# Patient Record
Sex: Female | Born: 1935
Health system: Southern US, Community
[De-identification: ages and names within clinical notes are randomized; demographics above are authoritative.]

## PROBLEM LIST (undated history)

## (undated) DIAGNOSIS — F329 Major depressive disorder, single episode, unspecified: Secondary | ICD-10-CM

## (undated) DIAGNOSIS — E119 Type 2 diabetes mellitus without complications: Secondary | ICD-10-CM

## (undated) DIAGNOSIS — F419 Anxiety disorder, unspecified: Secondary | ICD-10-CM

## (undated) DIAGNOSIS — I10 Essential (primary) hypertension: Secondary | ICD-10-CM

## (undated) DIAGNOSIS — M1990 Unspecified osteoarthritis, unspecified site: Secondary | ICD-10-CM

## (undated) DIAGNOSIS — N189 Chronic kidney disease, unspecified: Secondary | ICD-10-CM

## (undated) DIAGNOSIS — F32A Depression, unspecified: Secondary | ICD-10-CM

## (undated) DIAGNOSIS — C801 Malignant (primary) neoplasm, unspecified: Secondary | ICD-10-CM

## (undated) DIAGNOSIS — K219 Gastro-esophageal reflux disease without esophagitis: Secondary | ICD-10-CM

## (undated) DIAGNOSIS — IMO0002 Reserved for concepts with insufficient information to code with codable children: Secondary | ICD-10-CM

## (undated) DIAGNOSIS — E785 Hyperlipidemia, unspecified: Secondary | ICD-10-CM

## (undated) HISTORY — DX: Chronic kidney disease, unspecified: N18.9

## (undated) HISTORY — DX: Type 2 diabetes mellitus without complications: E11.9

## (undated) HISTORY — DX: Gastro-esophageal reflux disease without esophagitis: K21.9

## (undated) HISTORY — PX: MOHS SURGERY: SHX181

## (undated) HISTORY — DX: Depression, unspecified: F32.A

## (undated) HISTORY — PX: CHOLECYSTECTOMY: SHX55

## (undated) HISTORY — DX: Hyperlipidemia, unspecified: E78.5

## (undated) HISTORY — DX: Unspecified osteoarthritis, unspecified site: M19.90

## (undated) HISTORY — DX: Reserved for concepts with insufficient information to code with codable children: IMO0002

## (undated) HISTORY — DX: Major depressive disorder, single episode, unspecified: F32.9

## (undated) HISTORY — DX: Essential (primary) hypertension: I10

## (undated) HISTORY — PX: REPLACEMENT TOTAL KNEE: SUR1224

## (undated) HISTORY — DX: Anxiety disorder, unspecified: F41.9

## (undated) HISTORY — DX: Malignant (primary) neoplasm, unspecified: C80.1

---

## 2010-05-23 ENCOUNTER — Emergency Department (HOSPITAL_COMMUNITY)
Admission: EM | Admit: 2010-05-23 | Discharge: 2010-05-23 | Disposition: A | Discharge status: Home / Self Care | Payer: No Typology Code available for payment source | Attending: Emergency Medicine | Admitting: Emergency Medicine

## 2010-05-23 ENCOUNTER — Emergency Department (HOSPITAL_COMMUNITY): Payer: No Typology Code available for payment source

## 2010-05-23 DIAGNOSIS — R609 Edema, unspecified: Secondary | ICD-10-CM | POA: Insufficient documentation

## 2010-05-23 DIAGNOSIS — X58XXXA Exposure to other specified factors, initial encounter: Secondary | ICD-10-CM | POA: Insufficient documentation

## 2010-05-23 DIAGNOSIS — M109 Gout, unspecified: Secondary | ICD-10-CM | POA: Insufficient documentation

## 2010-05-23 DIAGNOSIS — F29 Unspecified psychosis not due to a substance or known physiological condition: Secondary | ICD-10-CM | POA: Insufficient documentation

## 2010-05-23 DIAGNOSIS — M7989 Other specified soft tissue disorders: Secondary | ICD-10-CM | POA: Insufficient documentation

## 2010-05-23 DIAGNOSIS — M129 Arthropathy, unspecified: Secondary | ICD-10-CM | POA: Insufficient documentation

## 2010-05-23 DIAGNOSIS — Y929 Unspecified place or not applicable: Secondary | ICD-10-CM | POA: Insufficient documentation

## 2010-05-23 DIAGNOSIS — M25579 Pain in unspecified ankle and joints of unspecified foot: Secondary | ICD-10-CM | POA: Insufficient documentation

## 2010-05-23 DIAGNOSIS — E119 Type 2 diabetes mellitus without complications: Secondary | ICD-10-CM | POA: Insufficient documentation

## 2010-05-23 DIAGNOSIS — L02619 Cutaneous abscess of unspecified foot: Secondary | ICD-10-CM | POA: Insufficient documentation

## 2010-05-23 DIAGNOSIS — IMO0002 Reserved for concepts with insufficient information to code with codable children: Secondary | ICD-10-CM | POA: Insufficient documentation

## 2010-05-23 LAB — POCT I-STAT, CHEM 8
BUN: 16 mg/dL (ref 6–23)
Calcium, Ion: 1.11 mmol/L — ABNORMAL LOW (ref 1.12–1.32)
Creatinine, Ser: 1.4 mg/dL — ABNORMAL HIGH (ref 0.4–1.2)
Glucose, Bld: 125 mg/dL — ABNORMAL HIGH (ref 70–99)
Hemoglobin: 11.2 g/dL — ABNORMAL LOW (ref 12.0–15.0)
TCO2: 33 mmol/L (ref 0–100)

## 2010-05-23 LAB — DIFFERENTIAL
Basophils Relative: 0 % (ref 0–1)
Eosinophils Absolute: 0 10*3/uL (ref 0.0–0.7)
Eosinophils Relative: 0 % (ref 0–5)
Monocytes Absolute: 1.3 10*3/uL — ABNORMAL HIGH (ref 0.1–1.0)
Monocytes Relative: 13 % — ABNORMAL HIGH (ref 3–12)

## 2010-05-23 LAB — CBC
Hemoglobin: 10.9 g/dL — ABNORMAL LOW (ref 12.0–15.0)
MCH: 29.4 pg (ref 26.0–34.0)
MCHC: 33.2 g/dL (ref 30.0–36.0)
Platelets: 206 10*3/uL (ref 150–400)
RDW: 13.6 % (ref 11.5–15.5)

## 2012-09-25 IMAGING — CR DG CHEST 2V
1 series · 1 of 1 positions shown · non-contrast
Comparison: None.

CLINICAL DATA: Syncope.  Bilateral foot swelling and pain.

CHEST - 2 VIEW

[w chest lat *]
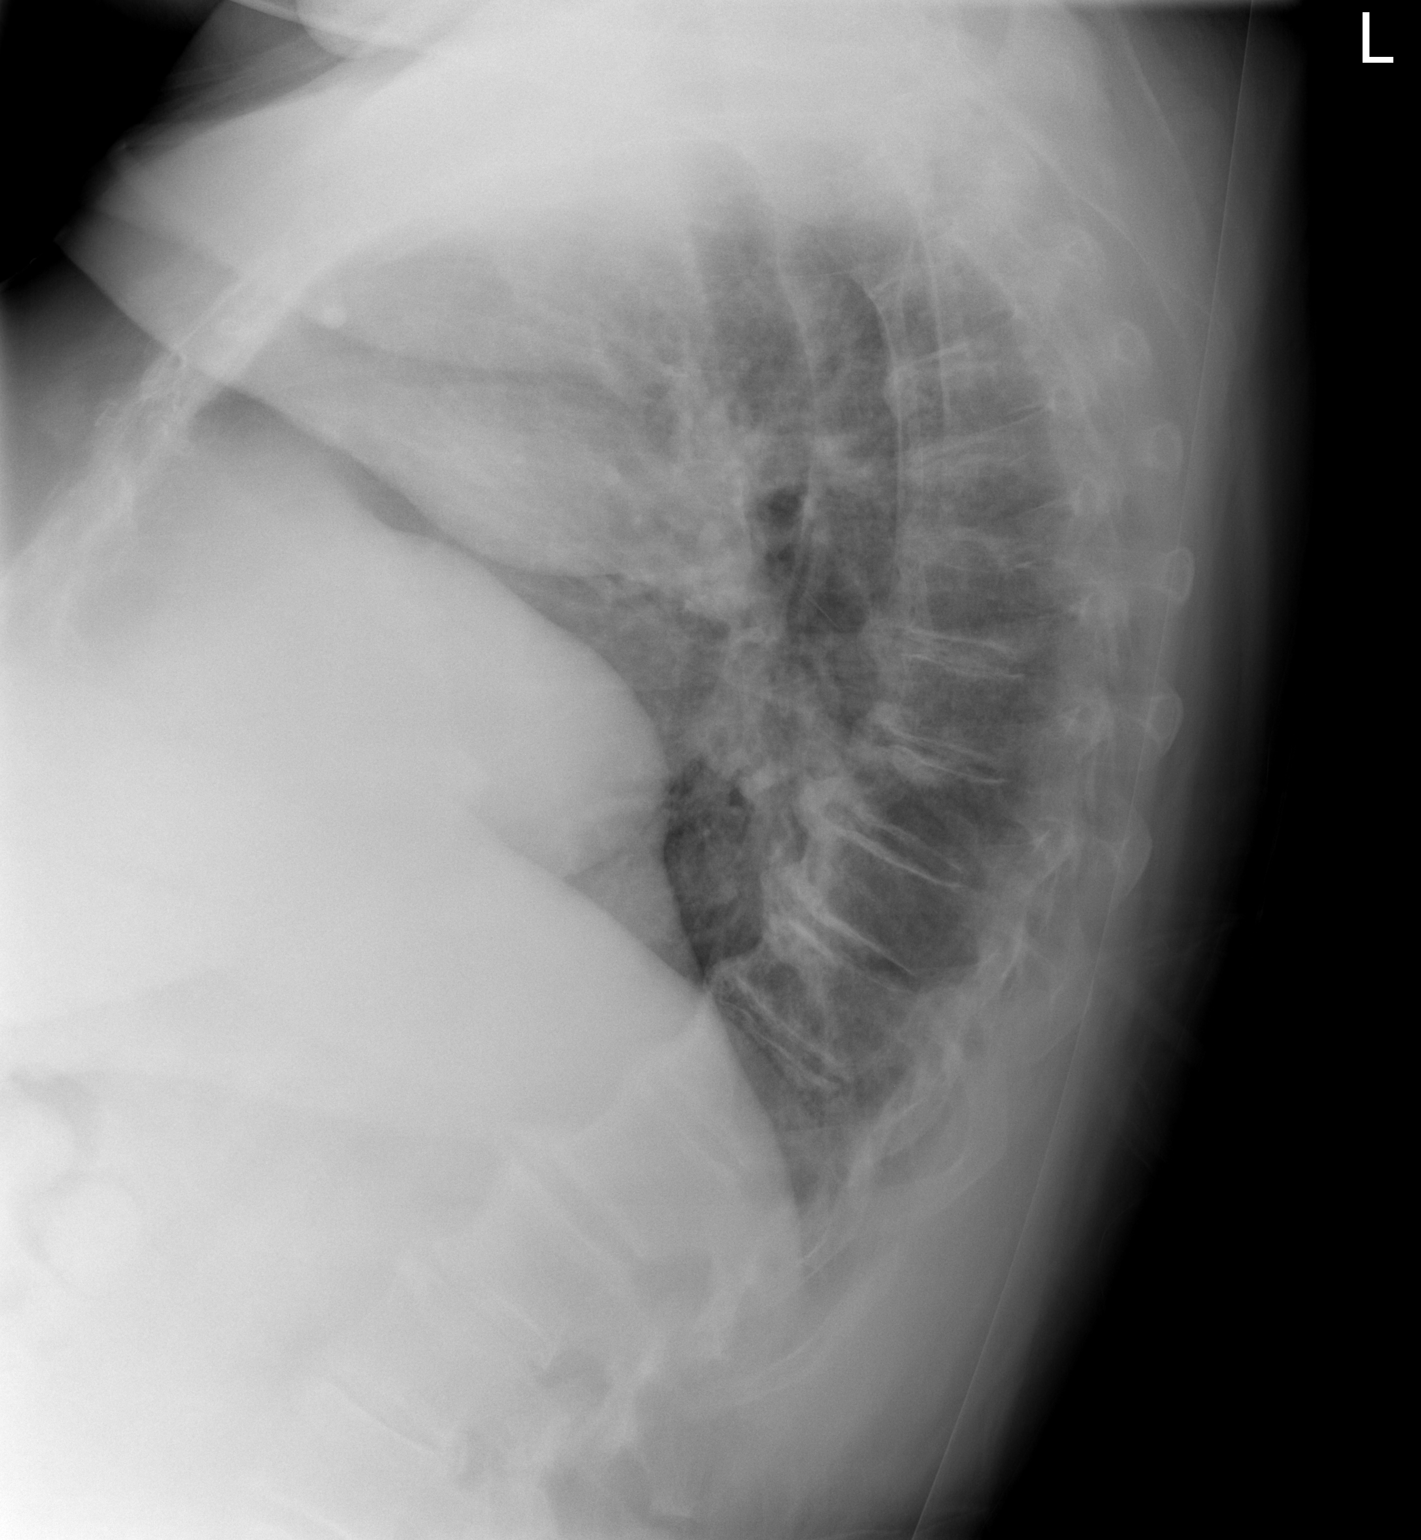

[1 of 1 positions shown; findings below may reference images not displayed]

FINDINGS: Shallow inspiration.  Increased heart size with normal
pulmonary vascularity.  No focal airspace disease or consolidation.
No pulmonary edema.  No blunting of costophrenic angles.
Degenerative changes in the spine.
IMPRESSION: Cardiac enlargement.  No evidence of active pulmonary disease.

## 2013-05-03 ENCOUNTER — Ambulatory Visit (INDEPENDENT_AMBULATORY_CARE_PROVIDER_SITE_OTHER): Payer: Medicare HMO

## 2013-05-03 ENCOUNTER — Ambulatory Visit (INDEPENDENT_AMBULATORY_CARE_PROVIDER_SITE_OTHER): Payer: Medicare HMO | Admitting: Podiatrist

## 2013-05-03 ENCOUNTER — Encounter: Payer: Self-pay | Admitting: Podiatrist

## 2013-05-03 VITALS — BP 143/70 | HR 61 | Resp 18

## 2013-05-03 DIAGNOSIS — M109 Gout, unspecified: Secondary | ICD-10-CM

## 2013-05-03 DIAGNOSIS — R52 Pain, unspecified: Secondary | ICD-10-CM

## 2013-05-03 NOTE — Progress Notes (Signed)
   Subjective:    Patient ID: Carla Robinson, female    DOB: Mar 31, 1934, 79 y.o.   MRN: [id number]  HPI patient presents today complaining of left foot pain. She states "my left foot has been going on for about since Friday and I took allopurinol and cochicine and it still didn't help and Dr. Truman Hayward put me on them and took me off of the fluid pill and now I am swelling in my legs and feet and he took me off of my diabetic medicine due I was in coma and my sugar was low" she states that after taking the colcrys her foot started to feel better. She does still have pain on the lateral forefoot. She also states that she has severe back pain and she is going to see Dr. Truman Hayward on Saturday about this.  Review of Systems  Endocrine: Positive for heat intolerance.  Musculoskeletal: Positive for back pain.  Skin:       Both legs   Neurological: Positive for numbness.  Hematological: Bruises/bleeds easily.  All other systems reviewed and are negative.      Objective:   Physical Exam Neurovascular status is intact with palpable pedal pulses at 2/4 DP and PT bilateral and capillary refill time within normal limits. Neurological sensation is also intact bilaterally both epicriticaly and protectively. Musculoskeletal examination reveals acceptable muscle, strength, tone and stability. Musculature intact with dorsiflexion, plantarflexion, inversion, eversion.  Mild swelling at the left foot compared to the right on the dorsal lateral aspect of the foot. No pain with dorsiflexion and plantarflexion of the first metatarsophalangeal joint left noted. Pinpoint discomfort dorsal lateral aspect with calor and redness present. No sign of infection present no open wounds or lesions of the foot noted.  X-rays are negative for fracture. No obvious gouty changes are noted.     Assessment & Plan:  Acute gout attack left  Plan: Injected Kenalog and Marcaine mixture into the left foot at the area of maximal tenderness under  sterile technique. The patient tolerated this well. I discussed having lab performed to analyze her uric acid levels and to see if the allopurinol is working for her. She states that she is going to see Dr. Truman Hayward on Saturday and thus I recommended her having the labs done at that time. She is to continue using the colcrys as needed. If the injection is not beneficial she will call and let me know.

## 2013-05-03 NOTE — Patient Instructions (Signed)

## 2014-01-21 DIAGNOSIS — R945 Abnormal results of liver function studies: Secondary | ICD-10-CM | POA: Diagnosis not present

## 2014-01-21 DIAGNOSIS — F419 Anxiety disorder, unspecified: Secondary | ICD-10-CM | POA: Diagnosis not present

## 2014-01-21 DIAGNOSIS — R600 Localized edema: Secondary | ICD-10-CM | POA: Diagnosis not present

## 2014-01-21 DIAGNOSIS — M810 Age-related osteoporosis without current pathological fracture: Secondary | ICD-10-CM | POA: Diagnosis not present

## 2014-01-21 DIAGNOSIS — E119 Type 2 diabetes mellitus without complications: Secondary | ICD-10-CM | POA: Diagnosis not present

## 2014-01-21 DIAGNOSIS — N189 Chronic kidney disease, unspecified: Secondary | ICD-10-CM | POA: Diagnosis not present

## 2014-01-21 DIAGNOSIS — E1140 Type 2 diabetes mellitus with diabetic neuropathy, unspecified: Secondary | ICD-10-CM | POA: Diagnosis not present

## 2014-01-21 DIAGNOSIS — F329 Major depressive disorder, single episode, unspecified: Secondary | ICD-10-CM | POA: Diagnosis not present

## 2014-02-18 DIAGNOSIS — E1140 Type 2 diabetes mellitus with diabetic neuropathy, unspecified: Secondary | ICD-10-CM | POA: Diagnosis not present

## 2014-02-18 DIAGNOSIS — M109 Gout, unspecified: Secondary | ICD-10-CM | POA: Diagnosis not present

## 2014-02-18 DIAGNOSIS — Z1389 Encounter for screening for other disorder: Secondary | ICD-10-CM | POA: Diagnosis not present

## 2014-02-18 DIAGNOSIS — F419 Anxiety disorder, unspecified: Secondary | ICD-10-CM | POA: Diagnosis not present

## 2014-02-18 DIAGNOSIS — E119 Type 2 diabetes mellitus without complications: Secondary | ICD-10-CM | POA: Diagnosis not present

## 2014-02-18 DIAGNOSIS — M810 Age-related osteoporosis without current pathological fracture: Secondary | ICD-10-CM | POA: Diagnosis not present

## 2014-02-18 DIAGNOSIS — Z9181 History of falling: Secondary | ICD-10-CM | POA: Diagnosis not present

## 2014-02-18 DIAGNOSIS — E780 Pure hypercholesterolemia: Secondary | ICD-10-CM | POA: Diagnosis not present

## 2014-03-18 DIAGNOSIS — R600 Localized edema: Secondary | ICD-10-CM | POA: Diagnosis not present

## 2014-03-18 DIAGNOSIS — R945 Abnormal results of liver function studies: Secondary | ICD-10-CM | POA: Diagnosis not present

## 2014-03-18 DIAGNOSIS — F419 Anxiety disorder, unspecified: Secondary | ICD-10-CM | POA: Diagnosis not present

## 2014-03-18 DIAGNOSIS — I10 Essential (primary) hypertension: Secondary | ICD-10-CM | POA: Diagnosis not present

## 2014-03-18 DIAGNOSIS — F329 Major depressive disorder, single episode, unspecified: Secondary | ICD-10-CM | POA: Diagnosis not present

## 2014-03-18 DIAGNOSIS — E119 Type 2 diabetes mellitus without complications: Secondary | ICD-10-CM | POA: Diagnosis not present

## 2014-03-18 DIAGNOSIS — M810 Age-related osteoporosis without current pathological fracture: Secondary | ICD-10-CM | POA: Diagnosis not present

## 2014-03-18 DIAGNOSIS — E1140 Type 2 diabetes mellitus with diabetic neuropathy, unspecified: Secondary | ICD-10-CM | POA: Diagnosis not present

## 2014-03-18 DIAGNOSIS — E780 Pure hypercholesterolemia: Secondary | ICD-10-CM | POA: Diagnosis not present

## 2014-04-01 DIAGNOSIS — E119 Type 2 diabetes mellitus without complications: Secondary | ICD-10-CM | POA: Diagnosis not present

## 2014-04-01 DIAGNOSIS — E780 Pure hypercholesterolemia: Secondary | ICD-10-CM | POA: Diagnosis not present

## 2014-04-01 DIAGNOSIS — R600 Localized edema: Secondary | ICD-10-CM | POA: Diagnosis not present

## 2014-04-01 DIAGNOSIS — E1140 Type 2 diabetes mellitus with diabetic neuropathy, unspecified: Secondary | ICD-10-CM | POA: Diagnosis not present

## 2014-04-01 DIAGNOSIS — F419 Anxiety disorder, unspecified: Secondary | ICD-10-CM | POA: Diagnosis not present

## 2014-04-01 DIAGNOSIS — M810 Age-related osteoporosis without current pathological fracture: Secondary | ICD-10-CM | POA: Diagnosis not present

## 2014-04-01 DIAGNOSIS — F329 Major depressive disorder, single episode, unspecified: Secondary | ICD-10-CM | POA: Diagnosis not present

## 2014-04-01 DIAGNOSIS — R945 Abnormal results of liver function studies: Secondary | ICD-10-CM | POA: Diagnosis not present

## 2014-04-05 DIAGNOSIS — Z13820 Encounter for screening for osteoporosis: Secondary | ICD-10-CM | POA: Diagnosis not present

## 2014-04-05 DIAGNOSIS — M810 Age-related osteoporosis without current pathological fracture: Secondary | ICD-10-CM | POA: Diagnosis not present

## 2014-04-15 DIAGNOSIS — F329 Major depressive disorder, single episode, unspecified: Secondary | ICD-10-CM | POA: Diagnosis not present

## 2014-04-15 DIAGNOSIS — R945 Abnormal results of liver function studies: Secondary | ICD-10-CM | POA: Diagnosis not present

## 2014-04-15 DIAGNOSIS — E119 Type 2 diabetes mellitus without complications: Secondary | ICD-10-CM | POA: Diagnosis not present

## 2014-04-15 DIAGNOSIS — M180 Bilateral primary osteoarthritis of first carpometacarpal joints: Secondary | ICD-10-CM | POA: Diagnosis not present

## 2014-04-15 DIAGNOSIS — E780 Pure hypercholesterolemia: Secondary | ICD-10-CM | POA: Diagnosis not present

## 2014-04-15 DIAGNOSIS — I10 Essential (primary) hypertension: Secondary | ICD-10-CM | POA: Diagnosis not present

## 2014-04-15 DIAGNOSIS — F419 Anxiety disorder, unspecified: Secondary | ICD-10-CM | POA: Diagnosis not present

## 2014-04-15 DIAGNOSIS — R600 Localized edema: Secondary | ICD-10-CM | POA: Diagnosis not present

## 2014-04-15 DIAGNOSIS — E1140 Type 2 diabetes mellitus with diabetic neuropathy, unspecified: Secondary | ICD-10-CM | POA: Diagnosis not present

## 2014-04-29 DIAGNOSIS — F329 Major depressive disorder, single episode, unspecified: Secondary | ICD-10-CM | POA: Diagnosis not present

## 2014-04-29 DIAGNOSIS — K219 Gastro-esophageal reflux disease without esophagitis: Secondary | ICD-10-CM | POA: Diagnosis not present

## 2014-04-29 DIAGNOSIS — R945 Abnormal results of liver function studies: Secondary | ICD-10-CM | POA: Diagnosis not present

## 2014-04-29 DIAGNOSIS — M810 Age-related osteoporosis without current pathological fracture: Secondary | ICD-10-CM | POA: Diagnosis not present

## 2014-04-29 DIAGNOSIS — E1140 Type 2 diabetes mellitus with diabetic neuropathy, unspecified: Secondary | ICD-10-CM | POA: Diagnosis not present

## 2014-04-29 DIAGNOSIS — E780 Pure hypercholesterolemia: Secondary | ICD-10-CM | POA: Diagnosis not present

## 2014-04-29 DIAGNOSIS — E119 Type 2 diabetes mellitus without complications: Secondary | ICD-10-CM | POA: Diagnosis not present

## 2014-04-29 DIAGNOSIS — F419 Anxiety disorder, unspecified: Secondary | ICD-10-CM | POA: Diagnosis not present

## 2014-05-17 DIAGNOSIS — L570 Actinic keratosis: Secondary | ICD-10-CM | POA: Diagnosis not present

## 2014-05-17 DIAGNOSIS — Z85828 Personal history of other malignant neoplasm of skin: Secondary | ICD-10-CM | POA: Diagnosis not present

## 2014-05-27 DIAGNOSIS — Z9181 History of falling: Secondary | ICD-10-CM | POA: Diagnosis not present

## 2014-05-27 DIAGNOSIS — K219 Gastro-esophageal reflux disease without esophagitis: Secondary | ICD-10-CM | POA: Diagnosis not present

## 2014-05-27 DIAGNOSIS — Z6835 Body mass index (BMI) 35.0-35.9, adult: Secondary | ICD-10-CM | POA: Diagnosis not present

## 2014-05-27 DIAGNOSIS — N189 Chronic kidney disease, unspecified: Secondary | ICD-10-CM | POA: Diagnosis not present

## 2014-05-27 DIAGNOSIS — E669 Obesity, unspecified: Secondary | ICD-10-CM | POA: Diagnosis not present

## 2014-05-27 DIAGNOSIS — M25562 Pain in left knee: Secondary | ICD-10-CM | POA: Diagnosis not present

## 2014-05-27 DIAGNOSIS — R600 Localized edema: Secondary | ICD-10-CM | POA: Diagnosis not present

## 2014-07-01 DIAGNOSIS — K219 Gastro-esophageal reflux disease without esophagitis: Secondary | ICD-10-CM | POA: Diagnosis not present

## 2014-07-01 DIAGNOSIS — E669 Obesity, unspecified: Secondary | ICD-10-CM | POA: Diagnosis not present

## 2014-07-01 DIAGNOSIS — E119 Type 2 diabetes mellitus without complications: Secondary | ICD-10-CM | POA: Diagnosis not present

## 2014-07-01 DIAGNOSIS — F329 Major depressive disorder, single episode, unspecified: Secondary | ICD-10-CM | POA: Diagnosis not present

## 2014-07-01 DIAGNOSIS — E780 Pure hypercholesterolemia: Secondary | ICD-10-CM | POA: Diagnosis not present

## 2014-07-01 DIAGNOSIS — F419 Anxiety disorder, unspecified: Secondary | ICD-10-CM | POA: Diagnosis not present

## 2014-07-01 DIAGNOSIS — M810 Age-related osteoporosis without current pathological fracture: Secondary | ICD-10-CM | POA: Diagnosis not present

## 2014-07-01 DIAGNOSIS — E1140 Type 2 diabetes mellitus with diabetic neuropathy, unspecified: Secondary | ICD-10-CM | POA: Diagnosis not present

## 2014-08-05 DIAGNOSIS — Z139 Encounter for screening, unspecified: Secondary | ICD-10-CM | POA: Diagnosis not present

## 2014-08-05 DIAGNOSIS — F329 Major depressive disorder, single episode, unspecified: Secondary | ICD-10-CM | POA: Diagnosis not present

## 2014-08-05 DIAGNOSIS — F419 Anxiety disorder, unspecified: Secondary | ICD-10-CM | POA: Diagnosis not present

## 2014-08-05 DIAGNOSIS — E119 Type 2 diabetes mellitus without complications: Secondary | ICD-10-CM | POA: Diagnosis not present

## 2014-08-05 DIAGNOSIS — K219 Gastro-esophageal reflux disease without esophagitis: Secondary | ICD-10-CM | POA: Diagnosis not present

## 2014-08-05 DIAGNOSIS — M810 Age-related osteoporosis without current pathological fracture: Secondary | ICD-10-CM | POA: Diagnosis not present

## 2014-08-05 DIAGNOSIS — E780 Pure hypercholesterolemia: Secondary | ICD-10-CM | POA: Diagnosis not present

## 2014-08-05 DIAGNOSIS — E1140 Type 2 diabetes mellitus with diabetic neuropathy, unspecified: Secondary | ICD-10-CM | POA: Diagnosis not present

## 2014-09-02 DIAGNOSIS — E119 Type 2 diabetes mellitus without complications: Secondary | ICD-10-CM | POA: Diagnosis not present

## 2014-09-02 DIAGNOSIS — F419 Anxiety disorder, unspecified: Secondary | ICD-10-CM | POA: Diagnosis not present

## 2014-09-02 DIAGNOSIS — K219 Gastro-esophageal reflux disease without esophagitis: Secondary | ICD-10-CM | POA: Diagnosis not present

## 2014-09-02 DIAGNOSIS — M810 Age-related osteoporosis without current pathological fracture: Secondary | ICD-10-CM | POA: Diagnosis not present

## 2014-09-02 DIAGNOSIS — R945 Abnormal results of liver function studies: Secondary | ICD-10-CM | POA: Diagnosis not present

## 2014-09-02 DIAGNOSIS — F329 Major depressive disorder, single episode, unspecified: Secondary | ICD-10-CM | POA: Diagnosis not present

## 2014-09-02 DIAGNOSIS — E1140 Type 2 diabetes mellitus with diabetic neuropathy, unspecified: Secondary | ICD-10-CM | POA: Diagnosis not present

## 2014-09-02 DIAGNOSIS — E780 Pure hypercholesterolemia: Secondary | ICD-10-CM | POA: Diagnosis not present

## 2014-09-07 DIAGNOSIS — H40003 Preglaucoma, unspecified, bilateral: Secondary | ICD-10-CM | POA: Diagnosis not present

## 2014-09-07 DIAGNOSIS — E119 Type 2 diabetes mellitus without complications: Secondary | ICD-10-CM | POA: Diagnosis not present

## 2014-09-07 DIAGNOSIS — H25813 Combined forms of age-related cataract, bilateral: Secondary | ICD-10-CM | POA: Diagnosis not present

## 2014-09-30 DIAGNOSIS — F419 Anxiety disorder, unspecified: Secondary | ICD-10-CM | POA: Diagnosis not present

## 2014-09-30 DIAGNOSIS — E780 Pure hypercholesterolemia: Secondary | ICD-10-CM | POA: Diagnosis not present

## 2014-09-30 DIAGNOSIS — M810 Age-related osteoporosis without current pathological fracture: Secondary | ICD-10-CM | POA: Diagnosis not present

## 2014-09-30 DIAGNOSIS — K219 Gastro-esophageal reflux disease without esophagitis: Secondary | ICD-10-CM | POA: Diagnosis not present

## 2014-09-30 DIAGNOSIS — E119 Type 2 diabetes mellitus without complications: Secondary | ICD-10-CM | POA: Diagnosis not present

## 2014-09-30 DIAGNOSIS — R945 Abnormal results of liver function studies: Secondary | ICD-10-CM | POA: Diagnosis not present

## 2014-09-30 DIAGNOSIS — E1140 Type 2 diabetes mellitus with diabetic neuropathy, unspecified: Secondary | ICD-10-CM | POA: Diagnosis not present

## 2014-09-30 DIAGNOSIS — F329 Major depressive disorder, single episode, unspecified: Secondary | ICD-10-CM | POA: Diagnosis not present

## 2014-09-30 DIAGNOSIS — I10 Essential (primary) hypertension: Secondary | ICD-10-CM | POA: Diagnosis not present

## 2014-10-13 DIAGNOSIS — E1140 Type 2 diabetes mellitus with diabetic neuropathy, unspecified: Secondary | ICD-10-CM | POA: Diagnosis not present

## 2014-10-13 DIAGNOSIS — H269 Unspecified cataract: Secondary | ICD-10-CM | POA: Diagnosis not present

## 2014-10-13 DIAGNOSIS — F329 Major depressive disorder, single episode, unspecified: Secondary | ICD-10-CM | POA: Diagnosis not present

## 2014-10-13 DIAGNOSIS — K219 Gastro-esophageal reflux disease without esophagitis: Secondary | ICD-10-CM | POA: Diagnosis not present

## 2014-10-13 DIAGNOSIS — F419 Anxiety disorder, unspecified: Secondary | ICD-10-CM | POA: Diagnosis not present

## 2014-10-13 DIAGNOSIS — E780 Pure hypercholesterolemia: Secondary | ICD-10-CM | POA: Diagnosis not present

## 2014-10-13 DIAGNOSIS — E119 Type 2 diabetes mellitus without complications: Secondary | ICD-10-CM | POA: Diagnosis not present

## 2014-10-13 DIAGNOSIS — M810 Age-related osteoporosis without current pathological fracture: Secondary | ICD-10-CM | POA: Diagnosis not present

## 2014-10-26 DIAGNOSIS — H40033 Anatomical narrow angle, bilateral: Secondary | ICD-10-CM | POA: Diagnosis not present

## 2014-10-26 DIAGNOSIS — H2512 Age-related nuclear cataract, left eye: Secondary | ICD-10-CM | POA: Diagnosis not present

## 2014-10-28 DIAGNOSIS — E1140 Type 2 diabetes mellitus with diabetic neuropathy, unspecified: Secondary | ICD-10-CM | POA: Diagnosis not present

## 2014-10-28 DIAGNOSIS — F329 Major depressive disorder, single episode, unspecified: Secondary | ICD-10-CM | POA: Diagnosis not present

## 2014-10-28 DIAGNOSIS — M109 Gout, unspecified: Secondary | ICD-10-CM | POA: Diagnosis not present

## 2014-10-28 DIAGNOSIS — F419 Anxiety disorder, unspecified: Secondary | ICD-10-CM | POA: Diagnosis not present

## 2014-10-28 DIAGNOSIS — K219 Gastro-esophageal reflux disease without esophagitis: Secondary | ICD-10-CM | POA: Diagnosis not present

## 2014-10-28 DIAGNOSIS — E119 Type 2 diabetes mellitus without complications: Secondary | ICD-10-CM | POA: Diagnosis not present

## 2014-10-28 DIAGNOSIS — E7800 Pure hypercholesterolemia, unspecified: Secondary | ICD-10-CM | POA: Diagnosis not present

## 2014-10-28 DIAGNOSIS — M810 Age-related osteoporosis without current pathological fracture: Secondary | ICD-10-CM | POA: Diagnosis not present

## 2014-11-21 DIAGNOSIS — M1990 Unspecified osteoarthritis, unspecified site: Secondary | ICD-10-CM | POA: Diagnosis not present

## 2014-11-21 DIAGNOSIS — K219 Gastro-esophageal reflux disease without esophagitis: Secondary | ICD-10-CM | POA: Diagnosis not present

## 2014-11-21 DIAGNOSIS — M109 Gout, unspecified: Secondary | ICD-10-CM | POA: Diagnosis not present

## 2014-11-21 DIAGNOSIS — I110 Hypertensive heart disease with heart failure: Secondary | ICD-10-CM | POA: Diagnosis not present

## 2014-11-21 DIAGNOSIS — E785 Hyperlipidemia, unspecified: Secondary | ICD-10-CM | POA: Diagnosis not present

## 2014-11-21 DIAGNOSIS — H25812 Combined forms of age-related cataract, left eye: Secondary | ICD-10-CM | POA: Diagnosis not present

## 2014-11-21 DIAGNOSIS — I509 Heart failure, unspecified: Secondary | ICD-10-CM | POA: Diagnosis not present

## 2014-11-21 DIAGNOSIS — F329 Major depressive disorder, single episode, unspecified: Secondary | ICD-10-CM | POA: Diagnosis not present

## 2014-11-21 DIAGNOSIS — H2512 Age-related nuclear cataract, left eye: Secondary | ICD-10-CM | POA: Diagnosis not present

## 2014-11-25 DIAGNOSIS — R945 Abnormal results of liver function studies: Secondary | ICD-10-CM | POA: Diagnosis not present

## 2014-11-25 DIAGNOSIS — E7800 Pure hypercholesterolemia, unspecified: Secondary | ICD-10-CM | POA: Diagnosis not present

## 2014-11-25 DIAGNOSIS — F419 Anxiety disorder, unspecified: Secondary | ICD-10-CM | POA: Diagnosis not present

## 2014-11-25 DIAGNOSIS — E1140 Type 2 diabetes mellitus with diabetic neuropathy, unspecified: Secondary | ICD-10-CM | POA: Diagnosis not present

## 2014-11-25 DIAGNOSIS — R600 Localized edema: Secondary | ICD-10-CM | POA: Diagnosis not present

## 2014-11-25 DIAGNOSIS — E119 Type 2 diabetes mellitus without complications: Secondary | ICD-10-CM | POA: Diagnosis not present

## 2014-11-25 DIAGNOSIS — K219 Gastro-esophageal reflux disease without esophagitis: Secondary | ICD-10-CM | POA: Diagnosis not present

## 2014-11-25 DIAGNOSIS — M810 Age-related osteoporosis without current pathological fracture: Secondary | ICD-10-CM | POA: Diagnosis not present

## 2014-12-23 DIAGNOSIS — E669 Obesity, unspecified: Secondary | ICD-10-CM | POA: Diagnosis not present

## 2014-12-23 DIAGNOSIS — I10 Essential (primary) hypertension: Secondary | ICD-10-CM | POA: Diagnosis not present

## 2014-12-23 DIAGNOSIS — K219 Gastro-esophageal reflux disease without esophagitis: Secondary | ICD-10-CM | POA: Diagnosis not present

## 2014-12-23 DIAGNOSIS — M810 Age-related osteoporosis without current pathological fracture: Secondary | ICD-10-CM | POA: Diagnosis not present

## 2014-12-23 DIAGNOSIS — F419 Anxiety disorder, unspecified: Secondary | ICD-10-CM | POA: Diagnosis not present

## 2014-12-23 DIAGNOSIS — R945 Abnormal results of liver function studies: Secondary | ICD-10-CM | POA: Diagnosis not present

## 2014-12-23 DIAGNOSIS — E119 Type 2 diabetes mellitus without complications: Secondary | ICD-10-CM | POA: Diagnosis not present

## 2014-12-23 DIAGNOSIS — E7800 Pure hypercholesterolemia, unspecified: Secondary | ICD-10-CM | POA: Diagnosis not present

## 2014-12-23 DIAGNOSIS — E1140 Type 2 diabetes mellitus with diabetic neuropathy, unspecified: Secondary | ICD-10-CM | POA: Diagnosis not present

## 2014-12-26 DIAGNOSIS — E785 Hyperlipidemia, unspecified: Secondary | ICD-10-CM | POA: Diagnosis not present

## 2014-12-26 DIAGNOSIS — I509 Heart failure, unspecified: Secondary | ICD-10-CM | POA: Diagnosis not present

## 2014-12-26 DIAGNOSIS — I110 Hypertensive heart disease with heart failure: Secondary | ICD-10-CM | POA: Diagnosis not present

## 2014-12-26 DIAGNOSIS — M109 Gout, unspecified: Secondary | ICD-10-CM | POA: Diagnosis not present

## 2014-12-26 DIAGNOSIS — F329 Major depressive disorder, single episode, unspecified: Secondary | ICD-10-CM | POA: Diagnosis not present

## 2014-12-26 DIAGNOSIS — E1136 Type 2 diabetes mellitus with diabetic cataract: Secondary | ICD-10-CM | POA: Diagnosis not present

## 2014-12-26 DIAGNOSIS — G4733 Obstructive sleep apnea (adult) (pediatric): Secondary | ICD-10-CM | POA: Diagnosis not present

## 2014-12-26 DIAGNOSIS — H2511 Age-related nuclear cataract, right eye: Secondary | ICD-10-CM | POA: Diagnosis not present

## 2014-12-26 DIAGNOSIS — H25811 Combined forms of age-related cataract, right eye: Secondary | ICD-10-CM | POA: Diagnosis not present

## 2014-12-26 DIAGNOSIS — K219 Gastro-esophageal reflux disease without esophagitis: Secondary | ICD-10-CM | POA: Diagnosis not present

## 2014-12-26 DIAGNOSIS — I10 Essential (primary) hypertension: Secondary | ICD-10-CM | POA: Diagnosis not present

## 2014-12-26 DIAGNOSIS — G4730 Sleep apnea, unspecified: Secondary | ICD-10-CM | POA: Diagnosis not present

## 2014-12-26 DIAGNOSIS — E119 Type 2 diabetes mellitus without complications: Secondary | ICD-10-CM | POA: Diagnosis not present

## 2015-01-27 DIAGNOSIS — K219 Gastro-esophageal reflux disease without esophagitis: Secondary | ICD-10-CM | POA: Diagnosis not present

## 2015-01-27 DIAGNOSIS — F419 Anxiety disorder, unspecified: Secondary | ICD-10-CM | POA: Diagnosis not present

## 2015-01-27 DIAGNOSIS — E7800 Pure hypercholesterolemia, unspecified: Secondary | ICD-10-CM | POA: Diagnosis not present

## 2015-01-27 DIAGNOSIS — M810 Age-related osteoporosis without current pathological fracture: Secondary | ICD-10-CM | POA: Diagnosis not present

## 2015-01-27 DIAGNOSIS — R600 Localized edema: Secondary | ICD-10-CM | POA: Diagnosis not present

## 2015-01-27 DIAGNOSIS — R945 Abnormal results of liver function studies: Secondary | ICD-10-CM | POA: Diagnosis not present

## 2015-01-27 DIAGNOSIS — E119 Type 2 diabetes mellitus without complications: Secondary | ICD-10-CM | POA: Diagnosis not present

## 2015-01-27 DIAGNOSIS — N184 Chronic kidney disease, stage 4 (severe): Secondary | ICD-10-CM | POA: Diagnosis not present

## 2015-01-27 DIAGNOSIS — E1140 Type 2 diabetes mellitus with diabetic neuropathy, unspecified: Secondary | ICD-10-CM | POA: Diagnosis not present

## 2015-02-24 DIAGNOSIS — E7800 Pure hypercholesterolemia, unspecified: Secondary | ICD-10-CM | POA: Diagnosis not present

## 2015-02-24 DIAGNOSIS — R600 Localized edema: Secondary | ICD-10-CM | POA: Diagnosis not present

## 2015-02-24 DIAGNOSIS — R945 Abnormal results of liver function studies: Secondary | ICD-10-CM | POA: Diagnosis not present

## 2015-02-24 DIAGNOSIS — K219 Gastro-esophageal reflux disease without esophagitis: Secondary | ICD-10-CM | POA: Diagnosis not present

## 2015-02-24 DIAGNOSIS — F419 Anxiety disorder, unspecified: Secondary | ICD-10-CM | POA: Diagnosis not present

## 2015-02-24 DIAGNOSIS — M810 Age-related osteoporosis without current pathological fracture: Secondary | ICD-10-CM | POA: Diagnosis not present

## 2015-02-24 DIAGNOSIS — E119 Type 2 diabetes mellitus without complications: Secondary | ICD-10-CM | POA: Diagnosis not present

## 2015-02-24 DIAGNOSIS — E1140 Type 2 diabetes mellitus with diabetic neuropathy, unspecified: Secondary | ICD-10-CM | POA: Diagnosis not present

## 2015-02-24 DIAGNOSIS — Z1389 Encounter for screening for other disorder: Secondary | ICD-10-CM | POA: Diagnosis not present

## 2015-03-07 DIAGNOSIS — C44711 Basal cell carcinoma of skin of unspecified lower limb, including hip: Secondary | ICD-10-CM | POA: Diagnosis not present

## 2015-03-07 DIAGNOSIS — C44719 Basal cell carcinoma of skin of left lower limb, including hip: Secondary | ICD-10-CM | POA: Diagnosis not present

## 2015-03-07 DIAGNOSIS — L570 Actinic keratosis: Secondary | ICD-10-CM | POA: Diagnosis not present

## 2015-03-07 DIAGNOSIS — D485 Neoplasm of uncertain behavior of skin: Secondary | ICD-10-CM | POA: Diagnosis not present

## 2015-03-07 DIAGNOSIS — Z85828 Personal history of other malignant neoplasm of skin: Secondary | ICD-10-CM | POA: Diagnosis not present

## 2015-03-24 DIAGNOSIS — E119 Type 2 diabetes mellitus without complications: Secondary | ICD-10-CM | POA: Diagnosis not present

## 2015-03-24 DIAGNOSIS — E7800 Pure hypercholesterolemia, unspecified: Secondary | ICD-10-CM | POA: Diagnosis not present

## 2015-03-24 DIAGNOSIS — I10 Essential (primary) hypertension: Secondary | ICD-10-CM | POA: Diagnosis not present

## 2015-03-24 DIAGNOSIS — R600 Localized edema: Secondary | ICD-10-CM | POA: Diagnosis not present

## 2015-03-24 DIAGNOSIS — F419 Anxiety disorder, unspecified: Secondary | ICD-10-CM | POA: Diagnosis not present

## 2015-03-24 DIAGNOSIS — M810 Age-related osteoporosis without current pathological fracture: Secondary | ICD-10-CM | POA: Diagnosis not present

## 2015-03-24 DIAGNOSIS — E1140 Type 2 diabetes mellitus with diabetic neuropathy, unspecified: Secondary | ICD-10-CM | POA: Diagnosis not present

## 2015-03-24 DIAGNOSIS — K219 Gastro-esophageal reflux disease without esophagitis: Secondary | ICD-10-CM | POA: Diagnosis not present

## 2015-03-24 DIAGNOSIS — N184 Chronic kidney disease, stage 4 (severe): Secondary | ICD-10-CM | POA: Diagnosis not present

## 2015-03-24 DIAGNOSIS — R945 Abnormal results of liver function studies: Secondary | ICD-10-CM | POA: Diagnosis not present

## 2015-03-27 DIAGNOSIS — L720 Epidermal cyst: Secondary | ICD-10-CM | POA: Diagnosis not present

## 2015-03-27 DIAGNOSIS — C44719 Basal cell carcinoma of skin of left lower limb, including hip: Secondary | ICD-10-CM | POA: Diagnosis not present

## 2015-04-25 DIAGNOSIS — L568 Other specified acute skin changes due to ultraviolet radiation: Secondary | ICD-10-CM | POA: Diagnosis not present

## 2015-04-28 DIAGNOSIS — I10 Essential (primary) hypertension: Secondary | ICD-10-CM | POA: Diagnosis not present

## 2015-05-23 DIAGNOSIS — H40003 Preglaucoma, unspecified, bilateral: Secondary | ICD-10-CM | POA: Diagnosis not present

## 2015-05-25 DIAGNOSIS — K219 Gastro-esophageal reflux disease without esophagitis: Secondary | ICD-10-CM | POA: Diagnosis not present

## 2015-05-25 DIAGNOSIS — Z79899 Other long term (current) drug therapy: Secondary | ICD-10-CM | POA: Diagnosis not present

## 2015-05-25 DIAGNOSIS — R945 Abnormal results of liver function studies: Secondary | ICD-10-CM | POA: Diagnosis not present

## 2015-05-25 DIAGNOSIS — F419 Anxiety disorder, unspecified: Secondary | ICD-10-CM | POA: Diagnosis not present

## 2015-05-25 DIAGNOSIS — E7800 Pure hypercholesterolemia, unspecified: Secondary | ICD-10-CM | POA: Diagnosis not present

## 2015-05-25 DIAGNOSIS — M810 Age-related osteoporosis without current pathological fracture: Secondary | ICD-10-CM | POA: Diagnosis not present

## 2015-05-25 DIAGNOSIS — E119 Type 2 diabetes mellitus without complications: Secondary | ICD-10-CM | POA: Diagnosis not present

## 2015-05-25 DIAGNOSIS — R600 Localized edema: Secondary | ICD-10-CM | POA: Diagnosis not present

## 2015-05-25 DIAGNOSIS — I10 Essential (primary) hypertension: Secondary | ICD-10-CM | POA: Diagnosis not present

## 2015-05-25 DIAGNOSIS — E1140 Type 2 diabetes mellitus with diabetic neuropathy, unspecified: Secondary | ICD-10-CM | POA: Diagnosis not present

## 2015-06-23 DIAGNOSIS — M810 Age-related osteoporosis without current pathological fracture: Secondary | ICD-10-CM | POA: Diagnosis not present

## 2015-06-23 DIAGNOSIS — R600 Localized edema: Secondary | ICD-10-CM | POA: Diagnosis not present

## 2015-06-23 DIAGNOSIS — K219 Gastro-esophageal reflux disease without esophagitis: Secondary | ICD-10-CM | POA: Diagnosis not present

## 2015-06-23 DIAGNOSIS — R945 Abnormal results of liver function studies: Secondary | ICD-10-CM | POA: Diagnosis not present

## 2015-06-23 DIAGNOSIS — I10 Essential (primary) hypertension: Secondary | ICD-10-CM | POA: Diagnosis not present

## 2015-06-23 DIAGNOSIS — E1140 Type 2 diabetes mellitus with diabetic neuropathy, unspecified: Secondary | ICD-10-CM | POA: Diagnosis not present

## 2015-06-23 DIAGNOSIS — N184 Chronic kidney disease, stage 4 (severe): Secondary | ICD-10-CM | POA: Diagnosis not present

## 2015-06-23 DIAGNOSIS — E7800 Pure hypercholesterolemia, unspecified: Secondary | ICD-10-CM | POA: Diagnosis not present

## 2015-06-23 DIAGNOSIS — E119 Type 2 diabetes mellitus without complications: Secondary | ICD-10-CM | POA: Diagnosis not present

## 2015-06-23 DIAGNOSIS — F419 Anxiety disorder, unspecified: Secondary | ICD-10-CM | POA: Diagnosis not present

## 2015-06-27 DIAGNOSIS — M1711 Unilateral primary osteoarthritis, right knee: Secondary | ICD-10-CM | POA: Diagnosis not present

## 2015-06-27 DIAGNOSIS — M545 Low back pain: Secondary | ICD-10-CM | POA: Diagnosis not present

## 2015-06-27 DIAGNOSIS — M1712 Unilateral primary osteoarthritis, left knee: Secondary | ICD-10-CM | POA: Diagnosis not present

## 2015-07-21 DIAGNOSIS — M810 Age-related osteoporosis without current pathological fracture: Secondary | ICD-10-CM | POA: Diagnosis not present

## 2015-07-21 DIAGNOSIS — K219 Gastro-esophageal reflux disease without esophagitis: Secondary | ICD-10-CM | POA: Diagnosis not present

## 2015-07-21 DIAGNOSIS — F419 Anxiety disorder, unspecified: Secondary | ICD-10-CM | POA: Diagnosis not present

## 2015-07-21 DIAGNOSIS — R945 Abnormal results of liver function studies: Secondary | ICD-10-CM | POA: Diagnosis not present

## 2015-07-21 DIAGNOSIS — R600 Localized edema: Secondary | ICD-10-CM | POA: Diagnosis not present

## 2015-07-21 DIAGNOSIS — E7800 Pure hypercholesterolemia, unspecified: Secondary | ICD-10-CM | POA: Diagnosis not present

## 2015-07-21 DIAGNOSIS — E119 Type 2 diabetes mellitus without complications: Secondary | ICD-10-CM | POA: Diagnosis not present

## 2015-07-21 DIAGNOSIS — E1140 Type 2 diabetes mellitus with diabetic neuropathy, unspecified: Secondary | ICD-10-CM | POA: Diagnosis not present

## 2015-07-27 DIAGNOSIS — M810 Age-related osteoporosis without current pathological fracture: Secondary | ICD-10-CM | POA: Diagnosis not present

## 2015-08-09 DIAGNOSIS — M810 Age-related osteoporosis without current pathological fracture: Secondary | ICD-10-CM | POA: Diagnosis not present

## 2015-08-09 DIAGNOSIS — E1140 Type 2 diabetes mellitus with diabetic neuropathy, unspecified: Secondary | ICD-10-CM | POA: Diagnosis not present

## 2015-08-09 DIAGNOSIS — K219 Gastro-esophageal reflux disease without esophagitis: Secondary | ICD-10-CM | POA: Diagnosis not present

## 2015-08-09 DIAGNOSIS — F419 Anxiety disorder, unspecified: Secondary | ICD-10-CM | POA: Diagnosis not present

## 2015-08-09 DIAGNOSIS — E119 Type 2 diabetes mellitus without complications: Secondary | ICD-10-CM | POA: Diagnosis not present

## 2015-08-09 DIAGNOSIS — E7800 Pure hypercholesterolemia, unspecified: Secondary | ICD-10-CM | POA: Diagnosis not present

## 2015-08-09 DIAGNOSIS — R945 Abnormal results of liver function studies: Secondary | ICD-10-CM | POA: Diagnosis not present

## 2015-08-09 DIAGNOSIS — R0600 Dyspnea, unspecified: Secondary | ICD-10-CM | POA: Diagnosis not present

## 2015-08-18 DIAGNOSIS — K219 Gastro-esophageal reflux disease without esophagitis: Secondary | ICD-10-CM | POA: Diagnosis not present

## 2015-08-18 DIAGNOSIS — I10 Essential (primary) hypertension: Secondary | ICD-10-CM | POA: Diagnosis not present

## 2015-08-18 DIAGNOSIS — E1140 Type 2 diabetes mellitus with diabetic neuropathy, unspecified: Secondary | ICD-10-CM | POA: Diagnosis not present

## 2015-08-18 DIAGNOSIS — E119 Type 2 diabetes mellitus without complications: Secondary | ICD-10-CM | POA: Diagnosis not present

## 2015-08-18 DIAGNOSIS — M810 Age-related osteoporosis without current pathological fracture: Secondary | ICD-10-CM | POA: Diagnosis not present

## 2015-08-18 DIAGNOSIS — E7800 Pure hypercholesterolemia, unspecified: Secondary | ICD-10-CM | POA: Diagnosis not present

## 2015-08-18 DIAGNOSIS — R945 Abnormal results of liver function studies: Secondary | ICD-10-CM | POA: Diagnosis not present

## 2015-08-18 DIAGNOSIS — F419 Anxiety disorder, unspecified: Secondary | ICD-10-CM | POA: Diagnosis not present

## 2015-08-18 DIAGNOSIS — Z79899 Other long term (current) drug therapy: Secondary | ICD-10-CM | POA: Diagnosis not present

## 2015-09-04 DIAGNOSIS — L089 Local infection of the skin and subcutaneous tissue, unspecified: Secondary | ICD-10-CM | POA: Diagnosis not present

## 2015-09-07 DIAGNOSIS — S90822A Blister (nonthermal), left foot, initial encounter: Secondary | ICD-10-CM | POA: Diagnosis not present

## 2015-09-21 DIAGNOSIS — L97509 Non-pressure chronic ulcer of other part of unspecified foot with unspecified severity: Secondary | ICD-10-CM | POA: Diagnosis not present

## 2015-09-21 DIAGNOSIS — L02612 Cutaneous abscess of left foot: Secondary | ICD-10-CM | POA: Diagnosis not present

## 2015-09-21 DIAGNOSIS — E1165 Type 2 diabetes mellitus with hyperglycemia: Secondary | ICD-10-CM | POA: Diagnosis not present

## 2015-09-21 DIAGNOSIS — E11621 Type 2 diabetes mellitus with foot ulcer: Secondary | ICD-10-CM | POA: Diagnosis not present

## 2015-09-21 DIAGNOSIS — L97429 Non-pressure chronic ulcer of left heel and midfoot with unspecified severity: Secondary | ICD-10-CM | POA: Diagnosis not present

## 2015-09-21 DIAGNOSIS — M79672 Pain in left foot: Secondary | ICD-10-CM | POA: Diagnosis not present

## 2015-09-22 DIAGNOSIS — E7800 Pure hypercholesterolemia, unspecified: Secondary | ICD-10-CM | POA: Diagnosis not present

## 2015-09-22 DIAGNOSIS — E119 Type 2 diabetes mellitus without complications: Secondary | ICD-10-CM | POA: Diagnosis not present

## 2015-09-22 DIAGNOSIS — K219 Gastro-esophageal reflux disease without esophagitis: Secondary | ICD-10-CM | POA: Diagnosis not present

## 2015-09-22 DIAGNOSIS — E1140 Type 2 diabetes mellitus with diabetic neuropathy, unspecified: Secondary | ICD-10-CM | POA: Diagnosis not present

## 2015-09-22 DIAGNOSIS — I10 Essential (primary) hypertension: Secondary | ICD-10-CM | POA: Diagnosis not present

## 2015-09-22 DIAGNOSIS — M810 Age-related osteoporosis without current pathological fracture: Secondary | ICD-10-CM | POA: Diagnosis not present

## 2015-09-22 DIAGNOSIS — R600 Localized edema: Secondary | ICD-10-CM | POA: Diagnosis not present

## 2015-09-22 DIAGNOSIS — R945 Abnormal results of liver function studies: Secondary | ICD-10-CM | POA: Diagnosis not present

## 2015-09-22 DIAGNOSIS — L03116 Cellulitis of left lower limb: Secondary | ICD-10-CM | POA: Diagnosis not present

## 2015-09-22 DIAGNOSIS — F419 Anxiety disorder, unspecified: Secondary | ICD-10-CM | POA: Diagnosis not present

## 2015-10-11 DIAGNOSIS — H1011 Acute atopic conjunctivitis, right eye: Secondary | ICD-10-CM | POA: Diagnosis not present

## 2015-10-19 DIAGNOSIS — M810 Age-related osteoporosis without current pathological fracture: Secondary | ICD-10-CM | POA: Diagnosis not present

## 2015-10-19 DIAGNOSIS — R945 Abnormal results of liver function studies: Secondary | ICD-10-CM | POA: Diagnosis not present

## 2015-10-19 DIAGNOSIS — F419 Anxiety disorder, unspecified: Secondary | ICD-10-CM | POA: Diagnosis not present

## 2015-10-19 DIAGNOSIS — N184 Chronic kidney disease, stage 4 (severe): Secondary | ICD-10-CM | POA: Diagnosis not present

## 2015-10-19 DIAGNOSIS — R600 Localized edema: Secondary | ICD-10-CM | POA: Diagnosis not present

## 2015-10-19 DIAGNOSIS — E7800 Pure hypercholesterolemia, unspecified: Secondary | ICD-10-CM | POA: Diagnosis not present

## 2015-10-19 DIAGNOSIS — E119 Type 2 diabetes mellitus without complications: Secondary | ICD-10-CM | POA: Diagnosis not present

## 2015-10-19 DIAGNOSIS — K219 Gastro-esophageal reflux disease without esophagitis: Secondary | ICD-10-CM | POA: Diagnosis not present

## 2015-10-19 DIAGNOSIS — E1140 Type 2 diabetes mellitus with diabetic neuropathy, unspecified: Secondary | ICD-10-CM | POA: Diagnosis not present

## 2015-10-31 DIAGNOSIS — R404 Transient alteration of awareness: Secondary | ICD-10-CM | POA: Diagnosis not present

## 2015-10-31 DIAGNOSIS — R0602 Shortness of breath: Secondary | ICD-10-CM | POA: Diagnosis not present

## 2015-10-31 DIAGNOSIS — R55 Syncope and collapse: Secondary | ICD-10-CM | POA: Diagnosis not present

## 2015-10-31 DIAGNOSIS — E119 Type 2 diabetes mellitus without complications: Secondary | ICD-10-CM | POA: Diagnosis not present

## 2015-10-31 DIAGNOSIS — E7800 Pure hypercholesterolemia, unspecified: Secondary | ICD-10-CM | POA: Diagnosis not present

## 2015-10-31 DIAGNOSIS — F061 Catatonic disorder due to known physiological condition: Secondary | ICD-10-CM | POA: Diagnosis not present

## 2015-10-31 DIAGNOSIS — F1910 Other psychoactive substance abuse, uncomplicated: Secondary | ICD-10-CM | POA: Diagnosis not present

## 2015-10-31 DIAGNOSIS — F419 Anxiety disorder, unspecified: Secondary | ICD-10-CM | POA: Diagnosis not present

## 2015-10-31 DIAGNOSIS — R401 Stupor: Secondary | ICD-10-CM | POA: Diagnosis not present

## 2015-10-31 DIAGNOSIS — F329 Major depressive disorder, single episode, unspecified: Secondary | ICD-10-CM | POA: Diagnosis not present

## 2015-10-31 DIAGNOSIS — K219 Gastro-esophageal reflux disease without esophagitis: Secondary | ICD-10-CM | POA: Diagnosis not present

## 2015-10-31 DIAGNOSIS — I110 Hypertensive heart disease with heart failure: Secondary | ICD-10-CM | POA: Diagnosis not present

## 2015-10-31 DIAGNOSIS — T1490XA Injury, unspecified, initial encounter: Secondary | ICD-10-CM | POA: Diagnosis not present

## 2015-11-01 DIAGNOSIS — Z79899 Other long term (current) drug therapy: Secondary | ICD-10-CM | POA: Diagnosis not present

## 2015-11-01 DIAGNOSIS — F419 Anxiety disorder, unspecified: Secondary | ICD-10-CM | POA: Diagnosis not present

## 2015-11-01 DIAGNOSIS — M810 Age-related osteoporosis without current pathological fracture: Secondary | ICD-10-CM | POA: Diagnosis not present

## 2015-11-01 DIAGNOSIS — K219 Gastro-esophageal reflux disease without esophagitis: Secondary | ICD-10-CM | POA: Diagnosis not present

## 2015-11-01 DIAGNOSIS — E7800 Pure hypercholesterolemia, unspecified: Secondary | ICD-10-CM | POA: Diagnosis not present

## 2015-11-01 DIAGNOSIS — R600 Localized edema: Secondary | ICD-10-CM | POA: Diagnosis not present

## 2015-11-01 DIAGNOSIS — R945 Abnormal results of liver function studies: Secondary | ICD-10-CM | POA: Diagnosis not present

## 2015-11-01 DIAGNOSIS — E1140 Type 2 diabetes mellitus with diabetic neuropathy, unspecified: Secondary | ICD-10-CM | POA: Diagnosis not present

## 2015-11-01 DIAGNOSIS — E119 Type 2 diabetes mellitus without complications: Secondary | ICD-10-CM | POA: Diagnosis not present

## 2015-11-17 DIAGNOSIS — K219 Gastro-esophageal reflux disease without esophagitis: Secondary | ICD-10-CM | POA: Diagnosis not present

## 2015-11-17 DIAGNOSIS — R945 Abnormal results of liver function studies: Secondary | ICD-10-CM | POA: Diagnosis not present

## 2015-11-17 DIAGNOSIS — R600 Localized edema: Secondary | ICD-10-CM | POA: Diagnosis not present

## 2015-11-17 DIAGNOSIS — E119 Type 2 diabetes mellitus without complications: Secondary | ICD-10-CM | POA: Diagnosis not present

## 2015-11-17 DIAGNOSIS — F419 Anxiety disorder, unspecified: Secondary | ICD-10-CM | POA: Diagnosis not present

## 2015-11-17 DIAGNOSIS — M159 Polyosteoarthritis, unspecified: Secondary | ICD-10-CM | POA: Diagnosis not present

## 2015-11-17 DIAGNOSIS — E7800 Pure hypercholesterolemia, unspecified: Secondary | ICD-10-CM | POA: Diagnosis not present

## 2015-11-17 DIAGNOSIS — N184 Chronic kidney disease, stage 4 (severe): Secondary | ICD-10-CM | POA: Diagnosis not present

## 2015-11-17 DIAGNOSIS — E1140 Type 2 diabetes mellitus with diabetic neuropathy, unspecified: Secondary | ICD-10-CM | POA: Diagnosis not present

## 2015-11-27 DIAGNOSIS — E119 Type 2 diabetes mellitus without complications: Secondary | ICD-10-CM | POA: Diagnosis not present

## 2015-11-27 DIAGNOSIS — H1011 Acute atopic conjunctivitis, right eye: Secondary | ICD-10-CM | POA: Diagnosis not present

## 2015-11-27 DIAGNOSIS — Z961 Presence of intraocular lens: Secondary | ICD-10-CM | POA: Diagnosis not present

## 2015-11-27 DIAGNOSIS — H04123 Dry eye syndrome of bilateral lacrimal glands: Secondary | ICD-10-CM | POA: Diagnosis not present

## 2015-12-25 DIAGNOSIS — E1140 Type 2 diabetes mellitus with diabetic neuropathy, unspecified: Secondary | ICD-10-CM | POA: Diagnosis not present

## 2015-12-25 DIAGNOSIS — R600 Localized edema: Secondary | ICD-10-CM | POA: Diagnosis not present

## 2015-12-25 DIAGNOSIS — E119 Type 2 diabetes mellitus without complications: Secondary | ICD-10-CM | POA: Diagnosis not present

## 2015-12-25 DIAGNOSIS — R945 Abnormal results of liver function studies: Secondary | ICD-10-CM | POA: Diagnosis not present

## 2015-12-25 DIAGNOSIS — N184 Chronic kidney disease, stage 4 (severe): Secondary | ICD-10-CM | POA: Diagnosis not present

## 2015-12-25 DIAGNOSIS — I10 Essential (primary) hypertension: Secondary | ICD-10-CM | POA: Diagnosis not present

## 2015-12-25 DIAGNOSIS — M159 Polyosteoarthritis, unspecified: Secondary | ICD-10-CM | POA: Diagnosis not present

## 2015-12-25 DIAGNOSIS — E7800 Pure hypercholesterolemia, unspecified: Secondary | ICD-10-CM | POA: Diagnosis not present

## 2015-12-25 DIAGNOSIS — K219 Gastro-esophageal reflux disease without esophagitis: Secondary | ICD-10-CM | POA: Diagnosis not present

## 2015-12-25 DIAGNOSIS — F419 Anxiety disorder, unspecified: Secondary | ICD-10-CM | POA: Diagnosis not present

## 2016-01-19 DIAGNOSIS — R945 Abnormal results of liver function studies: Secondary | ICD-10-CM | POA: Diagnosis not present

## 2016-01-19 DIAGNOSIS — K219 Gastro-esophageal reflux disease without esophagitis: Secondary | ICD-10-CM | POA: Diagnosis not present

## 2016-01-19 DIAGNOSIS — R600 Localized edema: Secondary | ICD-10-CM | POA: Diagnosis not present

## 2016-01-19 DIAGNOSIS — M25562 Pain in left knee: Secondary | ICD-10-CM | POA: Diagnosis not present

## 2016-01-19 DIAGNOSIS — E1140 Type 2 diabetes mellitus with diabetic neuropathy, unspecified: Secondary | ICD-10-CM | POA: Diagnosis not present

## 2016-01-19 DIAGNOSIS — E7800 Pure hypercholesterolemia, unspecified: Secondary | ICD-10-CM | POA: Diagnosis not present

## 2016-01-19 DIAGNOSIS — F419 Anxiety disorder, unspecified: Secondary | ICD-10-CM | POA: Diagnosis not present

## 2016-01-19 DIAGNOSIS — E119 Type 2 diabetes mellitus without complications: Secondary | ICD-10-CM | POA: Diagnosis not present

## 2016-01-19 DIAGNOSIS — N184 Chronic kidney disease, stage 4 (severe): Secondary | ICD-10-CM | POA: Diagnosis not present

## 2016-02-16 DIAGNOSIS — E7800 Pure hypercholesterolemia, unspecified: Secondary | ICD-10-CM | POA: Diagnosis not present

## 2016-02-16 DIAGNOSIS — N184 Chronic kidney disease, stage 4 (severe): Secondary | ICD-10-CM | POA: Diagnosis not present

## 2016-02-16 DIAGNOSIS — K219 Gastro-esophageal reflux disease without esophagitis: Secondary | ICD-10-CM | POA: Diagnosis not present

## 2016-02-16 DIAGNOSIS — E119 Type 2 diabetes mellitus without complications: Secondary | ICD-10-CM | POA: Diagnosis not present

## 2016-02-16 DIAGNOSIS — F419 Anxiety disorder, unspecified: Secondary | ICD-10-CM | POA: Diagnosis not present

## 2016-02-16 DIAGNOSIS — R945 Abnormal results of liver function studies: Secondary | ICD-10-CM | POA: Diagnosis not present

## 2016-02-16 DIAGNOSIS — M25562 Pain in left knee: Secondary | ICD-10-CM | POA: Diagnosis not present

## 2016-02-16 DIAGNOSIS — R600 Localized edema: Secondary | ICD-10-CM | POA: Diagnosis not present

## 2016-02-16 DIAGNOSIS — E1140 Type 2 diabetes mellitus with diabetic neuropathy, unspecified: Secondary | ICD-10-CM | POA: Diagnosis not present

## 2016-03-15 DIAGNOSIS — E7800 Pure hypercholesterolemia, unspecified: Secondary | ICD-10-CM | POA: Diagnosis not present

## 2016-03-15 DIAGNOSIS — K219 Gastro-esophageal reflux disease without esophagitis: Secondary | ICD-10-CM | POA: Diagnosis not present

## 2016-03-15 DIAGNOSIS — M109 Gout, unspecified: Secondary | ICD-10-CM | POA: Diagnosis not present

## 2016-03-15 DIAGNOSIS — N184 Chronic kidney disease, stage 4 (severe): Secondary | ICD-10-CM | POA: Diagnosis not present

## 2016-03-15 DIAGNOSIS — I10 Essential (primary) hypertension: Secondary | ICD-10-CM | POA: Diagnosis not present

## 2016-03-15 DIAGNOSIS — R945 Abnormal results of liver function studies: Secondary | ICD-10-CM | POA: Diagnosis not present

## 2016-03-15 DIAGNOSIS — Z1389 Encounter for screening for other disorder: Secondary | ICD-10-CM | POA: Diagnosis not present

## 2016-03-15 DIAGNOSIS — M810 Age-related osteoporosis without current pathological fracture: Secondary | ICD-10-CM | POA: Diagnosis not present

## 2016-03-15 DIAGNOSIS — E119 Type 2 diabetes mellitus without complications: Secondary | ICD-10-CM | POA: Diagnosis not present

## 2016-04-22 DIAGNOSIS — R945 Abnormal results of liver function studies: Secondary | ICD-10-CM | POA: Diagnosis not present

## 2016-04-22 DIAGNOSIS — I10 Essential (primary) hypertension: Secondary | ICD-10-CM | POA: Diagnosis not present

## 2016-04-22 DIAGNOSIS — N184 Chronic kidney disease, stage 4 (severe): Secondary | ICD-10-CM | POA: Diagnosis not present

## 2016-04-22 DIAGNOSIS — E7800 Pure hypercholesterolemia, unspecified: Secondary | ICD-10-CM | POA: Diagnosis not present

## 2016-04-22 DIAGNOSIS — E1140 Type 2 diabetes mellitus with diabetic neuropathy, unspecified: Secondary | ICD-10-CM | POA: Diagnosis not present

## 2016-04-22 DIAGNOSIS — M159 Polyosteoarthritis, unspecified: Secondary | ICD-10-CM | POA: Diagnosis not present

## 2016-04-22 DIAGNOSIS — E119 Type 2 diabetes mellitus without complications: Secondary | ICD-10-CM | POA: Diagnosis not present

## 2016-04-22 DIAGNOSIS — K219 Gastro-esophageal reflux disease without esophagitis: Secondary | ICD-10-CM | POA: Diagnosis not present

## 2016-04-22 DIAGNOSIS — F419 Anxiety disorder, unspecified: Secondary | ICD-10-CM | POA: Diagnosis not present

## 2016-04-22 DIAGNOSIS — R600 Localized edema: Secondary | ICD-10-CM | POA: Diagnosis not present

## 2016-05-22 DIAGNOSIS — R945 Abnormal results of liver function studies: Secondary | ICD-10-CM | POA: Diagnosis not present

## 2016-05-22 DIAGNOSIS — R600 Localized edema: Secondary | ICD-10-CM | POA: Diagnosis not present

## 2016-05-22 DIAGNOSIS — Z139 Encounter for screening, unspecified: Secondary | ICD-10-CM | POA: Diagnosis not present

## 2016-05-22 DIAGNOSIS — E119 Type 2 diabetes mellitus without complications: Secondary | ICD-10-CM | POA: Diagnosis not present

## 2016-05-22 DIAGNOSIS — E1140 Type 2 diabetes mellitus with diabetic neuropathy, unspecified: Secondary | ICD-10-CM | POA: Diagnosis not present

## 2016-05-22 DIAGNOSIS — E7800 Pure hypercholesterolemia, unspecified: Secondary | ICD-10-CM | POA: Diagnosis not present

## 2016-05-22 DIAGNOSIS — N184 Chronic kidney disease, stage 4 (severe): Secondary | ICD-10-CM | POA: Diagnosis not present

## 2016-05-22 DIAGNOSIS — K219 Gastro-esophageal reflux disease without esophagitis: Secondary | ICD-10-CM | POA: Diagnosis not present

## 2016-05-22 DIAGNOSIS — F419 Anxiety disorder, unspecified: Secondary | ICD-10-CM | POA: Diagnosis not present

## 2016-05-23 DIAGNOSIS — E1140 Type 2 diabetes mellitus with diabetic neuropathy, unspecified: Secondary | ICD-10-CM | POA: Diagnosis not present

## 2016-05-23 DIAGNOSIS — E7800 Pure hypercholesterolemia, unspecified: Secondary | ICD-10-CM | POA: Diagnosis not present

## 2016-05-23 DIAGNOSIS — N184 Chronic kidney disease, stage 4 (severe): Secondary | ICD-10-CM | POA: Diagnosis not present

## 2016-05-23 DIAGNOSIS — K219 Gastro-esophageal reflux disease without esophagitis: Secondary | ICD-10-CM | POA: Diagnosis not present

## 2016-05-23 DIAGNOSIS — F419 Anxiety disorder, unspecified: Secondary | ICD-10-CM | POA: Diagnosis not present

## 2016-05-23 DIAGNOSIS — F329 Major depressive disorder, single episode, unspecified: Secondary | ICD-10-CM | POA: Diagnosis not present

## 2016-05-23 DIAGNOSIS — E119 Type 2 diabetes mellitus without complications: Secondary | ICD-10-CM | POA: Diagnosis not present

## 2016-05-23 DIAGNOSIS — R945 Abnormal results of liver function studies: Secondary | ICD-10-CM | POA: Diagnosis not present

## 2016-05-23 DIAGNOSIS — M25562 Pain in left knee: Secondary | ICD-10-CM | POA: Diagnosis not present

## 2016-06-28 DIAGNOSIS — K219 Gastro-esophageal reflux disease without esophagitis: Secondary | ICD-10-CM | POA: Diagnosis not present

## 2016-06-28 DIAGNOSIS — N184 Chronic kidney disease, stage 4 (severe): Secondary | ICD-10-CM | POA: Diagnosis not present

## 2016-06-28 DIAGNOSIS — Z139 Encounter for screening, unspecified: Secondary | ICD-10-CM | POA: Diagnosis not present

## 2016-06-28 DIAGNOSIS — E7800 Pure hypercholesterolemia, unspecified: Secondary | ICD-10-CM | POA: Diagnosis not present

## 2016-06-28 DIAGNOSIS — F419 Anxiety disorder, unspecified: Secondary | ICD-10-CM | POA: Diagnosis not present

## 2016-06-28 DIAGNOSIS — E1140 Type 2 diabetes mellitus with diabetic neuropathy, unspecified: Secondary | ICD-10-CM | POA: Diagnosis not present

## 2016-06-28 DIAGNOSIS — R945 Abnormal results of liver function studies: Secondary | ICD-10-CM | POA: Diagnosis not present

## 2016-06-28 DIAGNOSIS — F329 Major depressive disorder, single episode, unspecified: Secondary | ICD-10-CM | POA: Diagnosis not present

## 2016-06-28 DIAGNOSIS — E119 Type 2 diabetes mellitus without complications: Secondary | ICD-10-CM | POA: Diagnosis not present

## 2016-07-02 DIAGNOSIS — M1712 Unilateral primary osteoarthritis, left knee: Secondary | ICD-10-CM | POA: Diagnosis not present

## 2016-07-02 DIAGNOSIS — M25562 Pain in left knee: Secondary | ICD-10-CM | POA: Diagnosis not present

## 2016-07-02 DIAGNOSIS — Z96651 Presence of right artificial knee joint: Secondary | ICD-10-CM | POA: Diagnosis not present

## 2016-07-02 DIAGNOSIS — M25462 Effusion, left knee: Secondary | ICD-10-CM | POA: Diagnosis not present

## 2016-07-09 DIAGNOSIS — M25562 Pain in left knee: Secondary | ICD-10-CM | POA: Diagnosis not present

## 2016-07-09 DIAGNOSIS — M1712 Unilateral primary osteoarthritis, left knee: Secondary | ICD-10-CM | POA: Diagnosis not present

## 2016-07-17 DIAGNOSIS — M25562 Pain in left knee: Secondary | ICD-10-CM | POA: Diagnosis not present

## 2016-07-17 DIAGNOSIS — M1712 Unilateral primary osteoarthritis, left knee: Secondary | ICD-10-CM | POA: Diagnosis not present

## 2016-07-23 DIAGNOSIS — M1712 Unilateral primary osteoarthritis, left knee: Secondary | ICD-10-CM | POA: Diagnosis not present

## 2016-07-23 DIAGNOSIS — M25562 Pain in left knee: Secondary | ICD-10-CM | POA: Diagnosis not present

## 2016-07-25 DIAGNOSIS — Z139 Encounter for screening, unspecified: Secondary | ICD-10-CM | POA: Diagnosis not present

## 2016-07-25 DIAGNOSIS — R945 Abnormal results of liver function studies: Secondary | ICD-10-CM | POA: Diagnosis not present

## 2016-07-25 DIAGNOSIS — N184 Chronic kidney disease, stage 4 (severe): Secondary | ICD-10-CM | POA: Diagnosis not present

## 2016-07-25 DIAGNOSIS — E119 Type 2 diabetes mellitus without complications: Secondary | ICD-10-CM | POA: Diagnosis not present

## 2016-07-25 DIAGNOSIS — I10 Essential (primary) hypertension: Secondary | ICD-10-CM | POA: Diagnosis not present

## 2016-07-25 DIAGNOSIS — E7800 Pure hypercholesterolemia, unspecified: Secondary | ICD-10-CM | POA: Diagnosis not present

## 2016-07-25 DIAGNOSIS — K219 Gastro-esophageal reflux disease without esophagitis: Secondary | ICD-10-CM | POA: Diagnosis not present

## 2016-07-25 DIAGNOSIS — E1140 Type 2 diabetes mellitus with diabetic neuropathy, unspecified: Secondary | ICD-10-CM | POA: Diagnosis not present

## 2016-07-25 DIAGNOSIS — F419 Anxiety disorder, unspecified: Secondary | ICD-10-CM | POA: Diagnosis not present

## 2016-07-25 DIAGNOSIS — F329 Major depressive disorder, single episode, unspecified: Secondary | ICD-10-CM | POA: Diagnosis not present

## 2016-08-21 DIAGNOSIS — I10 Essential (primary) hypertension: Secondary | ICD-10-CM | POA: Diagnosis not present

## 2016-08-21 DIAGNOSIS — F329 Major depressive disorder, single episode, unspecified: Secondary | ICD-10-CM | POA: Diagnosis not present

## 2016-08-21 DIAGNOSIS — N184 Chronic kidney disease, stage 4 (severe): Secondary | ICD-10-CM | POA: Diagnosis not present

## 2016-08-21 DIAGNOSIS — Z9181 History of falling: Secondary | ICD-10-CM | POA: Diagnosis not present

## 2016-08-21 DIAGNOSIS — E7800 Pure hypercholesterolemia, unspecified: Secondary | ICD-10-CM | POA: Diagnosis not present

## 2016-08-21 DIAGNOSIS — R945 Abnormal results of liver function studies: Secondary | ICD-10-CM | POA: Diagnosis not present

## 2016-08-21 DIAGNOSIS — F419 Anxiety disorder, unspecified: Secondary | ICD-10-CM | POA: Diagnosis not present

## 2016-08-21 DIAGNOSIS — K219 Gastro-esophageal reflux disease without esophagitis: Secondary | ICD-10-CM | POA: Diagnosis not present

## 2016-08-21 DIAGNOSIS — E1140 Type 2 diabetes mellitus with diabetic neuropathy, unspecified: Secondary | ICD-10-CM | POA: Diagnosis not present

## 2016-08-21 DIAGNOSIS — E119 Type 2 diabetes mellitus without complications: Secondary | ICD-10-CM | POA: Diagnosis not present

## 2016-09-16 DIAGNOSIS — D044 Carcinoma in situ of skin of scalp and neck: Secondary | ICD-10-CM | POA: Diagnosis not present

## 2016-09-18 DIAGNOSIS — N289 Disorder of kidney and ureter, unspecified: Secondary | ICD-10-CM | POA: Diagnosis not present

## 2016-09-18 DIAGNOSIS — E1140 Type 2 diabetes mellitus with diabetic neuropathy, unspecified: Secondary | ICD-10-CM | POA: Diagnosis not present

## 2016-09-18 DIAGNOSIS — N184 Chronic kidney disease, stage 4 (severe): Secondary | ICD-10-CM | POA: Diagnosis not present

## 2016-09-18 DIAGNOSIS — F329 Major depressive disorder, single episode, unspecified: Secondary | ICD-10-CM | POA: Diagnosis not present

## 2016-09-18 DIAGNOSIS — E7800 Pure hypercholesterolemia, unspecified: Secondary | ICD-10-CM | POA: Diagnosis not present

## 2016-09-18 DIAGNOSIS — F419 Anxiety disorder, unspecified: Secondary | ICD-10-CM | POA: Diagnosis not present

## 2016-09-18 DIAGNOSIS — E119 Type 2 diabetes mellitus without complications: Secondary | ICD-10-CM | POA: Diagnosis not present

## 2016-09-18 DIAGNOSIS — R945 Abnormal results of liver function studies: Secondary | ICD-10-CM | POA: Diagnosis not present

## 2016-09-18 DIAGNOSIS — I10 Essential (primary) hypertension: Secondary | ICD-10-CM | POA: Diagnosis not present

## 2016-09-25 DIAGNOSIS — S81812A Laceration without foreign body, left lower leg, initial encounter: Secondary | ICD-10-CM | POA: Diagnosis not present

## 2016-10-16 DIAGNOSIS — R945 Abnormal results of liver function studies: Secondary | ICD-10-CM | POA: Diagnosis not present

## 2016-10-16 DIAGNOSIS — E119 Type 2 diabetes mellitus without complications: Secondary | ICD-10-CM | POA: Diagnosis not present

## 2016-10-16 DIAGNOSIS — L309 Dermatitis, unspecified: Secondary | ICD-10-CM | POA: Diagnosis not present

## 2016-10-16 DIAGNOSIS — N184 Chronic kidney disease, stage 4 (severe): Secondary | ICD-10-CM | POA: Diagnosis not present

## 2016-10-16 DIAGNOSIS — E7800 Pure hypercholesterolemia, unspecified: Secondary | ICD-10-CM | POA: Diagnosis not present

## 2016-10-16 DIAGNOSIS — F419 Anxiety disorder, unspecified: Secondary | ICD-10-CM | POA: Diagnosis not present

## 2016-10-16 DIAGNOSIS — E1140 Type 2 diabetes mellitus with diabetic neuropathy, unspecified: Secondary | ICD-10-CM | POA: Diagnosis not present

## 2016-10-16 DIAGNOSIS — N289 Disorder of kidney and ureter, unspecified: Secondary | ICD-10-CM | POA: Diagnosis not present

## 2016-10-16 DIAGNOSIS — K219 Gastro-esophageal reflux disease without esophagitis: Secondary | ICD-10-CM | POA: Diagnosis not present

## 2016-10-19 DIAGNOSIS — R0602 Shortness of breath: Secondary | ICD-10-CM | POA: Diagnosis not present

## 2016-10-19 DIAGNOSIS — E1122 Type 2 diabetes mellitus with diabetic chronic kidney disease: Secondary | ICD-10-CM | POA: Diagnosis not present

## 2016-10-19 DIAGNOSIS — E7800 Pure hypercholesterolemia, unspecified: Secondary | ICD-10-CM | POA: Diagnosis not present

## 2016-10-19 DIAGNOSIS — N183 Chronic kidney disease, stage 3 (moderate): Secondary | ICD-10-CM | POA: Diagnosis not present

## 2016-10-19 DIAGNOSIS — Z7982 Long term (current) use of aspirin: Secondary | ICD-10-CM | POA: Diagnosis not present

## 2016-10-19 DIAGNOSIS — R51 Headache: Secondary | ICD-10-CM | POA: Diagnosis not present

## 2016-10-19 DIAGNOSIS — R110 Nausea: Secondary | ICD-10-CM | POA: Diagnosis not present

## 2016-10-19 DIAGNOSIS — L03116 Cellulitis of left lower limb: Secondary | ICD-10-CM | POA: Diagnosis not present

## 2016-10-19 DIAGNOSIS — M79662 Pain in left lower leg: Secondary | ICD-10-CM | POA: Diagnosis not present

## 2016-10-19 DIAGNOSIS — I129 Hypertensive chronic kidney disease with stage 1 through stage 4 chronic kidney disease, or unspecified chronic kidney disease: Secondary | ICD-10-CM | POA: Diagnosis not present

## 2016-10-20 DIAGNOSIS — L03116 Cellulitis of left lower limb: Secondary | ICD-10-CM | POA: Diagnosis not present

## 2016-10-20 DIAGNOSIS — M79662 Pain in left lower leg: Secondary | ICD-10-CM | POA: Diagnosis not present

## 2016-11-13 DIAGNOSIS — K219 Gastro-esophageal reflux disease without esophagitis: Secondary | ICD-10-CM | POA: Diagnosis not present

## 2016-11-13 DIAGNOSIS — M810 Age-related osteoporosis without current pathological fracture: Secondary | ICD-10-CM | POA: Diagnosis not present

## 2016-11-13 DIAGNOSIS — E7800 Pure hypercholesterolemia, unspecified: Secondary | ICD-10-CM | POA: Diagnosis not present

## 2016-11-13 DIAGNOSIS — R945 Abnormal results of liver function studies: Secondary | ICD-10-CM | POA: Diagnosis not present

## 2016-11-13 DIAGNOSIS — E1140 Type 2 diabetes mellitus with diabetic neuropathy, unspecified: Secondary | ICD-10-CM | POA: Diagnosis not present

## 2016-11-13 DIAGNOSIS — E119 Type 2 diabetes mellitus without complications: Secondary | ICD-10-CM | POA: Diagnosis not present

## 2016-11-13 DIAGNOSIS — F419 Anxiety disorder, unspecified: Secondary | ICD-10-CM | POA: Diagnosis not present

## 2016-11-13 DIAGNOSIS — L03116 Cellulitis of left lower limb: Secondary | ICD-10-CM | POA: Diagnosis not present

## 2016-11-13 DIAGNOSIS — N289 Disorder of kidney and ureter, unspecified: Secondary | ICD-10-CM | POA: Diagnosis not present

## 2016-11-18 DIAGNOSIS — F419 Anxiety disorder, unspecified: Secondary | ICD-10-CM | POA: Diagnosis not present

## 2016-11-18 DIAGNOSIS — E7800 Pure hypercholesterolemia, unspecified: Secondary | ICD-10-CM | POA: Diagnosis not present

## 2016-11-18 DIAGNOSIS — R945 Abnormal results of liver function studies: Secondary | ICD-10-CM | POA: Diagnosis not present

## 2016-11-18 DIAGNOSIS — E1140 Type 2 diabetes mellitus with diabetic neuropathy, unspecified: Secondary | ICD-10-CM | POA: Diagnosis not present

## 2016-11-18 DIAGNOSIS — L309 Dermatitis, unspecified: Secondary | ICD-10-CM | POA: Diagnosis not present

## 2016-11-18 DIAGNOSIS — M810 Age-related osteoporosis without current pathological fracture: Secondary | ICD-10-CM | POA: Diagnosis not present

## 2016-11-18 DIAGNOSIS — N289 Disorder of kidney and ureter, unspecified: Secondary | ICD-10-CM | POA: Diagnosis not present

## 2016-11-18 DIAGNOSIS — K219 Gastro-esophageal reflux disease without esophagitis: Secondary | ICD-10-CM | POA: Diagnosis not present

## 2016-11-18 DIAGNOSIS — E119 Type 2 diabetes mellitus without complications: Secondary | ICD-10-CM | POA: Diagnosis not present

## 2016-12-19 DIAGNOSIS — N289 Disorder of kidney and ureter, unspecified: Secondary | ICD-10-CM | POA: Diagnosis not present

## 2016-12-19 DIAGNOSIS — E119 Type 2 diabetes mellitus without complications: Secondary | ICD-10-CM | POA: Diagnosis not present

## 2016-12-19 DIAGNOSIS — I10 Essential (primary) hypertension: Secondary | ICD-10-CM | POA: Diagnosis not present

## 2016-12-19 DIAGNOSIS — N184 Chronic kidney disease, stage 4 (severe): Secondary | ICD-10-CM | POA: Diagnosis not present

## 2016-12-19 DIAGNOSIS — F419 Anxiety disorder, unspecified: Secondary | ICD-10-CM | POA: Diagnosis not present

## 2016-12-19 DIAGNOSIS — E7800 Pure hypercholesterolemia, unspecified: Secondary | ICD-10-CM | POA: Diagnosis not present

## 2016-12-19 DIAGNOSIS — R945 Abnormal results of liver function studies: Secondary | ICD-10-CM | POA: Diagnosis not present

## 2016-12-19 DIAGNOSIS — E1140 Type 2 diabetes mellitus with diabetic neuropathy, unspecified: Secondary | ICD-10-CM | POA: Diagnosis not present

## 2016-12-19 DIAGNOSIS — M810 Age-related osteoporosis without current pathological fracture: Secondary | ICD-10-CM | POA: Diagnosis not present

## 2016-12-19 DIAGNOSIS — K219 Gastro-esophageal reflux disease without esophagitis: Secondary | ICD-10-CM | POA: Diagnosis not present

## 2017-01-19 DIAGNOSIS — E1140 Type 2 diabetes mellitus with diabetic neuropathy, unspecified: Secondary | ICD-10-CM | POA: Diagnosis not present

## 2017-01-19 DIAGNOSIS — N289 Disorder of kidney and ureter, unspecified: Secondary | ICD-10-CM | POA: Diagnosis not present

## 2017-01-19 DIAGNOSIS — E119 Type 2 diabetes mellitus without complications: Secondary | ICD-10-CM | POA: Diagnosis not present

## 2017-01-19 DIAGNOSIS — M810 Age-related osteoporosis without current pathological fracture: Secondary | ICD-10-CM | POA: Diagnosis not present

## 2017-01-19 DIAGNOSIS — N184 Chronic kidney disease, stage 4 (severe): Secondary | ICD-10-CM | POA: Diagnosis not present

## 2017-01-19 DIAGNOSIS — R945 Abnormal results of liver function studies: Secondary | ICD-10-CM | POA: Diagnosis not present

## 2017-01-19 DIAGNOSIS — F419 Anxiety disorder, unspecified: Secondary | ICD-10-CM | POA: Diagnosis not present

## 2017-01-19 DIAGNOSIS — E7800 Pure hypercholesterolemia, unspecified: Secondary | ICD-10-CM | POA: Diagnosis not present

## 2017-01-19 DIAGNOSIS — K219 Gastro-esophageal reflux disease without esophagitis: Secondary | ICD-10-CM | POA: Diagnosis not present

## 2017-01-26 DIAGNOSIS — M21062 Valgus deformity, not elsewhere classified, left knee: Secondary | ICD-10-CM | POA: Diagnosis not present

## 2017-01-26 DIAGNOSIS — M1712 Unilateral primary osteoarthritis, left knee: Secondary | ICD-10-CM | POA: Diagnosis not present

## 2017-01-26 DIAGNOSIS — M25562 Pain in left knee: Secondary | ICD-10-CM | POA: Diagnosis not present

## 2017-02-18 DIAGNOSIS — I10 Essential (primary) hypertension: Secondary | ICD-10-CM | POA: Diagnosis not present

## 2017-02-18 DIAGNOSIS — Z7982 Long term (current) use of aspirin: Secondary | ICD-10-CM | POA: Diagnosis not present

## 2017-02-18 DIAGNOSIS — Z79899 Other long term (current) drug therapy: Secondary | ICD-10-CM | POA: Diagnosis not present

## 2017-02-18 DIAGNOSIS — E7800 Pure hypercholesterolemia, unspecified: Secondary | ICD-10-CM | POA: Diagnosis not present

## 2017-02-18 DIAGNOSIS — R110 Nausea: Secondary | ICD-10-CM | POA: Diagnosis not present

## 2017-02-18 DIAGNOSIS — R1032 Left lower quadrant pain: Secondary | ICD-10-CM | POA: Diagnosis not present

## 2017-02-18 DIAGNOSIS — R1031 Right lower quadrant pain: Secondary | ICD-10-CM | POA: Diagnosis not present

## 2017-02-18 DIAGNOSIS — E119 Type 2 diabetes mellitus without complications: Secondary | ICD-10-CM | POA: Diagnosis not present

## 2017-02-18 DIAGNOSIS — Z882 Allergy status to sulfonamides status: Secondary | ICD-10-CM | POA: Diagnosis not present

## 2017-02-18 DIAGNOSIS — K5900 Constipation, unspecified: Secondary | ICD-10-CM | POA: Diagnosis not present

## 2017-02-22 DIAGNOSIS — J111 Influenza due to unidentified influenza virus with other respiratory manifestations: Secondary | ICD-10-CM | POA: Diagnosis not present

## 2017-03-02 DIAGNOSIS — F329 Major depressive disorder, single episode, unspecified: Secondary | ICD-10-CM | POA: Diagnosis not present

## 2017-03-02 DIAGNOSIS — R600 Localized edema: Secondary | ICD-10-CM | POA: Diagnosis not present

## 2017-03-02 DIAGNOSIS — I10 Essential (primary) hypertension: Secondary | ICD-10-CM | POA: Diagnosis not present

## 2017-03-02 DIAGNOSIS — J208 Acute bronchitis due to other specified organisms: Secondary | ICD-10-CM | POA: Diagnosis not present

## 2017-03-02 DIAGNOSIS — E1140 Type 2 diabetes mellitus with diabetic neuropathy, unspecified: Secondary | ICD-10-CM | POA: Diagnosis not present

## 2017-03-02 DIAGNOSIS — N184 Chronic kidney disease, stage 4 (severe): Secondary | ICD-10-CM | POA: Diagnosis not present

## 2017-03-02 DIAGNOSIS — E119 Type 2 diabetes mellitus without complications: Secondary | ICD-10-CM | POA: Diagnosis not present

## 2017-03-02 DIAGNOSIS — R0600 Dyspnea, unspecified: Secondary | ICD-10-CM | POA: Diagnosis not present

## 2017-03-02 DIAGNOSIS — M159 Polyosteoarthritis, unspecified: Secondary | ICD-10-CM | POA: Diagnosis not present

## 2017-03-09 DIAGNOSIS — M25562 Pain in left knee: Secondary | ICD-10-CM | POA: Diagnosis not present

## 2017-03-09 DIAGNOSIS — M1712 Unilateral primary osteoarthritis, left knee: Secondary | ICD-10-CM | POA: Diagnosis not present

## 2017-03-09 DIAGNOSIS — R262 Difficulty in walking, not elsewhere classified: Secondary | ICD-10-CM | POA: Diagnosis not present

## 2017-03-30 DIAGNOSIS — E1140 Type 2 diabetes mellitus with diabetic neuropathy, unspecified: Secondary | ICD-10-CM | POA: Diagnosis not present

## 2017-03-30 DIAGNOSIS — M810 Age-related osteoporosis without current pathological fracture: Secondary | ICD-10-CM | POA: Diagnosis not present

## 2017-03-30 DIAGNOSIS — R945 Abnormal results of liver function studies: Secondary | ICD-10-CM | POA: Diagnosis not present

## 2017-03-30 DIAGNOSIS — F419 Anxiety disorder, unspecified: Secondary | ICD-10-CM | POA: Diagnosis not present

## 2017-03-30 DIAGNOSIS — K219 Gastro-esophageal reflux disease without esophagitis: Secondary | ICD-10-CM | POA: Diagnosis not present

## 2017-03-30 DIAGNOSIS — E7800 Pure hypercholesterolemia, unspecified: Secondary | ICD-10-CM | POA: Diagnosis not present

## 2017-03-30 DIAGNOSIS — N184 Chronic kidney disease, stage 4 (severe): Secondary | ICD-10-CM | POA: Diagnosis not present

## 2017-03-30 DIAGNOSIS — E119 Type 2 diabetes mellitus without complications: Secondary | ICD-10-CM | POA: Diagnosis not present

## 2017-03-30 DIAGNOSIS — N289 Disorder of kidney and ureter, unspecified: Secondary | ICD-10-CM | POA: Diagnosis not present

## 2017-04-20 DIAGNOSIS — F419 Anxiety disorder, unspecified: Secondary | ICD-10-CM | POA: Diagnosis not present

## 2017-04-20 DIAGNOSIS — R945 Abnormal results of liver function studies: Secondary | ICD-10-CM | POA: Diagnosis not present

## 2017-04-20 DIAGNOSIS — E1140 Type 2 diabetes mellitus with diabetic neuropathy, unspecified: Secondary | ICD-10-CM | POA: Diagnosis not present

## 2017-04-20 DIAGNOSIS — E119 Type 2 diabetes mellitus without complications: Secondary | ICD-10-CM | POA: Diagnosis not present

## 2017-04-20 DIAGNOSIS — I10 Essential (primary) hypertension: Secondary | ICD-10-CM | POA: Diagnosis not present

## 2017-04-20 DIAGNOSIS — E7800 Pure hypercholesterolemia, unspecified: Secondary | ICD-10-CM | POA: Diagnosis not present

## 2017-04-20 DIAGNOSIS — N289 Disorder of kidney and ureter, unspecified: Secondary | ICD-10-CM | POA: Diagnosis not present

## 2017-04-20 DIAGNOSIS — N184 Chronic kidney disease, stage 4 (severe): Secondary | ICD-10-CM | POA: Diagnosis not present

## 2017-04-20 DIAGNOSIS — K219 Gastro-esophageal reflux disease without esophagitis: Secondary | ICD-10-CM | POA: Diagnosis not present

## 2017-04-20 DIAGNOSIS — M810 Age-related osteoporosis without current pathological fracture: Secondary | ICD-10-CM | POA: Diagnosis not present

## 2017-04-25 DIAGNOSIS — E7800 Pure hypercholesterolemia, unspecified: Secondary | ICD-10-CM | POA: Diagnosis not present

## 2017-04-25 DIAGNOSIS — M810 Age-related osteoporosis without current pathological fracture: Secondary | ICD-10-CM | POA: Diagnosis not present

## 2017-04-25 DIAGNOSIS — N184 Chronic kidney disease, stage 4 (severe): Secondary | ICD-10-CM | POA: Diagnosis not present

## 2017-04-25 DIAGNOSIS — N289 Disorder of kidney and ureter, unspecified: Secondary | ICD-10-CM | POA: Diagnosis not present

## 2017-04-25 DIAGNOSIS — K219 Gastro-esophageal reflux disease without esophagitis: Secondary | ICD-10-CM | POA: Diagnosis not present

## 2017-04-25 DIAGNOSIS — E1140 Type 2 diabetes mellitus with diabetic neuropathy, unspecified: Secondary | ICD-10-CM | POA: Diagnosis not present

## 2017-04-25 DIAGNOSIS — R945 Abnormal results of liver function studies: Secondary | ICD-10-CM | POA: Diagnosis not present

## 2017-04-25 DIAGNOSIS — E119 Type 2 diabetes mellitus without complications: Secondary | ICD-10-CM | POA: Diagnosis not present

## 2017-05-05 DIAGNOSIS — E119 Type 2 diabetes mellitus without complications: Secondary | ICD-10-CM | POA: Diagnosis not present

## 2017-05-05 DIAGNOSIS — R945 Abnormal results of liver function studies: Secondary | ICD-10-CM | POA: Diagnosis not present

## 2017-05-05 DIAGNOSIS — N184 Chronic kidney disease, stage 4 (severe): Secondary | ICD-10-CM | POA: Diagnosis not present

## 2017-05-05 DIAGNOSIS — E7800 Pure hypercholesterolemia, unspecified: Secondary | ICD-10-CM | POA: Diagnosis not present

## 2017-05-05 DIAGNOSIS — M810 Age-related osteoporosis without current pathological fracture: Secondary | ICD-10-CM | POA: Diagnosis not present

## 2017-05-05 DIAGNOSIS — K219 Gastro-esophageal reflux disease without esophagitis: Secondary | ICD-10-CM | POA: Diagnosis not present

## 2017-05-05 DIAGNOSIS — L03114 Cellulitis of left upper limb: Secondary | ICD-10-CM | POA: Diagnosis not present

## 2017-05-05 DIAGNOSIS — E1140 Type 2 diabetes mellitus with diabetic neuropathy, unspecified: Secondary | ICD-10-CM | POA: Diagnosis not present

## 2017-05-13 DIAGNOSIS — L03114 Cellulitis of left upper limb: Secondary | ICD-10-CM | POA: Diagnosis not present

## 2017-05-13 DIAGNOSIS — N184 Chronic kidney disease, stage 4 (severe): Secondary | ICD-10-CM | POA: Diagnosis not present

## 2017-05-13 DIAGNOSIS — N289 Disorder of kidney and ureter, unspecified: Secondary | ICD-10-CM | POA: Diagnosis not present

## 2017-05-13 DIAGNOSIS — E7800 Pure hypercholesterolemia, unspecified: Secondary | ICD-10-CM | POA: Diagnosis not present

## 2017-05-13 DIAGNOSIS — M810 Age-related osteoporosis without current pathological fracture: Secondary | ICD-10-CM | POA: Diagnosis not present

## 2017-05-13 DIAGNOSIS — E119 Type 2 diabetes mellitus without complications: Secondary | ICD-10-CM | POA: Diagnosis not present

## 2017-05-13 DIAGNOSIS — K219 Gastro-esophageal reflux disease without esophagitis: Secondary | ICD-10-CM | POA: Diagnosis not present

## 2017-05-13 DIAGNOSIS — R945 Abnormal results of liver function studies: Secondary | ICD-10-CM | POA: Diagnosis not present

## 2017-05-13 DIAGNOSIS — E1140 Type 2 diabetes mellitus with diabetic neuropathy, unspecified: Secondary | ICD-10-CM | POA: Diagnosis not present

## 2017-05-22 DIAGNOSIS — M1712 Unilateral primary osteoarthritis, left knee: Secondary | ICD-10-CM | POA: Diagnosis not present

## 2017-06-10 DIAGNOSIS — E7800 Pure hypercholesterolemia, unspecified: Secondary | ICD-10-CM | POA: Diagnosis not present

## 2017-06-10 DIAGNOSIS — N289 Disorder of kidney and ureter, unspecified: Secondary | ICD-10-CM | POA: Diagnosis not present

## 2017-06-10 DIAGNOSIS — R945 Abnormal results of liver function studies: Secondary | ICD-10-CM | POA: Diagnosis not present

## 2017-06-10 DIAGNOSIS — M810 Age-related osteoporosis without current pathological fracture: Secondary | ICD-10-CM | POA: Diagnosis not present

## 2017-06-10 DIAGNOSIS — R0600 Dyspnea, unspecified: Secondary | ICD-10-CM | POA: Diagnosis not present

## 2017-06-10 DIAGNOSIS — N184 Chronic kidney disease, stage 4 (severe): Secondary | ICD-10-CM | POA: Diagnosis not present

## 2017-06-10 DIAGNOSIS — E119 Type 2 diabetes mellitus without complications: Secondary | ICD-10-CM | POA: Diagnosis not present

## 2017-06-10 DIAGNOSIS — E1140 Type 2 diabetes mellitus with diabetic neuropathy, unspecified: Secondary | ICD-10-CM | POA: Diagnosis not present

## 2017-06-10 DIAGNOSIS — K219 Gastro-esophageal reflux disease without esophagitis: Secondary | ICD-10-CM | POA: Diagnosis not present

## 2017-06-23 DIAGNOSIS — D485 Neoplasm of uncertain behavior of skin: Secondary | ICD-10-CM | POA: Diagnosis not present

## 2017-06-23 DIAGNOSIS — L570 Actinic keratosis: Secondary | ICD-10-CM | POA: Diagnosis not present

## 2017-06-23 DIAGNOSIS — D044 Carcinoma in situ of skin of scalp and neck: Secondary | ICD-10-CM | POA: Diagnosis not present

## 2017-06-23 DIAGNOSIS — Z85828 Personal history of other malignant neoplasm of skin: Secondary | ICD-10-CM | POA: Diagnosis not present

## 2017-06-24 DIAGNOSIS — L570 Actinic keratosis: Secondary | ICD-10-CM | POA: Diagnosis not present

## 2017-06-24 DIAGNOSIS — C44319 Basal cell carcinoma of skin of other parts of face: Secondary | ICD-10-CM | POA: Diagnosis not present

## 2017-06-24 DIAGNOSIS — D0462 Carcinoma in situ of skin of left upper limb, including shoulder: Secondary | ICD-10-CM | POA: Diagnosis not present

## 2017-07-10 DIAGNOSIS — R600 Localized edema: Secondary | ICD-10-CM | POA: Diagnosis not present

## 2017-07-10 DIAGNOSIS — E1140 Type 2 diabetes mellitus with diabetic neuropathy, unspecified: Secondary | ICD-10-CM | POA: Diagnosis not present

## 2017-07-10 DIAGNOSIS — K219 Gastro-esophageal reflux disease without esophagitis: Secondary | ICD-10-CM | POA: Diagnosis not present

## 2017-07-10 DIAGNOSIS — M810 Age-related osteoporosis without current pathological fracture: Secondary | ICD-10-CM | POA: Diagnosis not present

## 2017-07-10 DIAGNOSIS — N289 Disorder of kidney and ureter, unspecified: Secondary | ICD-10-CM | POA: Diagnosis not present

## 2017-07-10 DIAGNOSIS — R002 Palpitations: Secondary | ICD-10-CM | POA: Diagnosis not present

## 2017-07-10 DIAGNOSIS — E119 Type 2 diabetes mellitus without complications: Secondary | ICD-10-CM | POA: Diagnosis not present

## 2017-07-10 DIAGNOSIS — E7800 Pure hypercholesterolemia, unspecified: Secondary | ICD-10-CM | POA: Diagnosis not present

## 2017-07-10 DIAGNOSIS — N184 Chronic kidney disease, stage 4 (severe): Secondary | ICD-10-CM | POA: Diagnosis not present

## 2017-07-21 DIAGNOSIS — R002 Palpitations: Secondary | ICD-10-CM | POA: Diagnosis not present

## 2017-08-06 DIAGNOSIS — L578 Other skin changes due to chronic exposure to nonionizing radiation: Secondary | ICD-10-CM | POA: Diagnosis not present

## 2017-08-06 DIAGNOSIS — C44219 Basal cell carcinoma of skin of left ear and external auricular canal: Secondary | ICD-10-CM | POA: Diagnosis not present

## 2017-08-06 DIAGNOSIS — C4491 Basal cell carcinoma of skin, unspecified: Secondary | ICD-10-CM | POA: Insufficient documentation

## 2017-08-06 DIAGNOSIS — C44319 Basal cell carcinoma of skin of other parts of face: Secondary | ICD-10-CM | POA: Diagnosis not present

## 2017-08-06 DIAGNOSIS — Z85828 Personal history of other malignant neoplasm of skin: Secondary | ICD-10-CM | POA: Diagnosis not present

## 2017-08-08 DIAGNOSIS — E7800 Pure hypercholesterolemia, unspecified: Secondary | ICD-10-CM | POA: Diagnosis not present

## 2017-08-08 DIAGNOSIS — E1140 Type 2 diabetes mellitus with diabetic neuropathy, unspecified: Secondary | ICD-10-CM | POA: Diagnosis not present

## 2017-08-08 DIAGNOSIS — N184 Chronic kidney disease, stage 4 (severe): Secondary | ICD-10-CM | POA: Diagnosis not present

## 2017-08-08 DIAGNOSIS — R600 Localized edema: Secondary | ICD-10-CM | POA: Diagnosis not present

## 2017-08-08 DIAGNOSIS — E119 Type 2 diabetes mellitus without complications: Secondary | ICD-10-CM | POA: Diagnosis not present

## 2017-08-08 DIAGNOSIS — I10 Essential (primary) hypertension: Secondary | ICD-10-CM | POA: Diagnosis not present

## 2017-08-08 DIAGNOSIS — R002 Palpitations: Secondary | ICD-10-CM | POA: Diagnosis not present

## 2017-08-08 DIAGNOSIS — Z1339 Encounter for screening examination for other mental health and behavioral disorders: Secondary | ICD-10-CM | POA: Diagnosis not present

## 2017-08-20 DIAGNOSIS — M25551 Pain in right hip: Secondary | ICD-10-CM | POA: Diagnosis not present

## 2017-08-20 DIAGNOSIS — E7800 Pure hypercholesterolemia, unspecified: Secondary | ICD-10-CM | POA: Diagnosis not present

## 2017-08-20 DIAGNOSIS — E1140 Type 2 diabetes mellitus with diabetic neuropathy, unspecified: Secondary | ICD-10-CM | POA: Diagnosis not present

## 2017-08-20 DIAGNOSIS — N289 Disorder of kidney and ureter, unspecified: Secondary | ICD-10-CM | POA: Diagnosis not present

## 2017-08-20 DIAGNOSIS — N184 Chronic kidney disease, stage 4 (severe): Secondary | ICD-10-CM | POA: Diagnosis not present

## 2017-08-20 DIAGNOSIS — M810 Age-related osteoporosis without current pathological fracture: Secondary | ICD-10-CM | POA: Diagnosis not present

## 2017-08-20 DIAGNOSIS — E119 Type 2 diabetes mellitus without complications: Secondary | ICD-10-CM | POA: Diagnosis not present

## 2017-08-20 DIAGNOSIS — R002 Palpitations: Secondary | ICD-10-CM | POA: Diagnosis not present

## 2017-08-20 DIAGNOSIS — I10 Essential (primary) hypertension: Secondary | ICD-10-CM | POA: Diagnosis not present

## 2017-09-03 DIAGNOSIS — M25551 Pain in right hip: Secondary | ICD-10-CM | POA: Diagnosis not present

## 2017-09-03 DIAGNOSIS — R0600 Dyspnea, unspecified: Secondary | ICD-10-CM | POA: Diagnosis not present

## 2017-09-03 DIAGNOSIS — E1140 Type 2 diabetes mellitus with diabetic neuropathy, unspecified: Secondary | ICD-10-CM | POA: Diagnosis not present

## 2017-09-03 DIAGNOSIS — N184 Chronic kidney disease, stage 4 (severe): Secondary | ICD-10-CM | POA: Diagnosis not present

## 2017-09-03 DIAGNOSIS — K219 Gastro-esophageal reflux disease without esophagitis: Secondary | ICD-10-CM | POA: Diagnosis not present

## 2017-09-03 DIAGNOSIS — M810 Age-related osteoporosis without current pathological fracture: Secondary | ICD-10-CM | POA: Diagnosis not present

## 2017-09-03 DIAGNOSIS — R945 Abnormal results of liver function studies: Secondary | ICD-10-CM | POA: Diagnosis not present

## 2017-09-03 DIAGNOSIS — N289 Disorder of kidney and ureter, unspecified: Secondary | ICD-10-CM | POA: Diagnosis not present

## 2017-09-03 DIAGNOSIS — R002 Palpitations: Secondary | ICD-10-CM | POA: Diagnosis not present

## 2017-09-22 DIAGNOSIS — Z9181 History of falling: Secondary | ICD-10-CM | POA: Diagnosis not present

## 2017-09-22 DIAGNOSIS — Z139 Encounter for screening, unspecified: Secondary | ICD-10-CM | POA: Diagnosis not present

## 2017-09-22 DIAGNOSIS — E785 Hyperlipidemia, unspecified: Secondary | ICD-10-CM | POA: Diagnosis not present

## 2017-09-22 DIAGNOSIS — Z0000 Encounter for general adult medical examination without abnormal findings: Secondary | ICD-10-CM | POA: Diagnosis not present

## 2017-09-22 DIAGNOSIS — E669 Obesity, unspecified: Secondary | ICD-10-CM | POA: Diagnosis not present

## 2017-09-22 DIAGNOSIS — Z6841 Body Mass Index (BMI) 40.0 and over, adult: Secondary | ICD-10-CM | POA: Diagnosis not present

## 2017-09-22 DIAGNOSIS — Z1331 Encounter for screening for depression: Secondary | ICD-10-CM | POA: Diagnosis not present

## 2017-10-01 DIAGNOSIS — N184 Chronic kidney disease, stage 4 (severe): Secondary | ICD-10-CM | POA: Diagnosis not present

## 2017-10-01 DIAGNOSIS — F329 Major depressive disorder, single episode, unspecified: Secondary | ICD-10-CM | POA: Diagnosis not present

## 2017-10-01 DIAGNOSIS — R0600 Dyspnea, unspecified: Secondary | ICD-10-CM | POA: Diagnosis not present

## 2017-10-01 DIAGNOSIS — K219 Gastro-esophageal reflux disease without esophagitis: Secondary | ICD-10-CM | POA: Diagnosis not present

## 2017-10-01 DIAGNOSIS — R945 Abnormal results of liver function studies: Secondary | ICD-10-CM | POA: Diagnosis not present

## 2017-10-01 DIAGNOSIS — E1140 Type 2 diabetes mellitus with diabetic neuropathy, unspecified: Secondary | ICD-10-CM | POA: Diagnosis not present

## 2017-10-01 DIAGNOSIS — R5382 Chronic fatigue, unspecified: Secondary | ICD-10-CM | POA: Diagnosis not present

## 2017-10-01 DIAGNOSIS — N289 Disorder of kidney and ureter, unspecified: Secondary | ICD-10-CM | POA: Diagnosis not present

## 2017-10-01 DIAGNOSIS — M810 Age-related osteoporosis without current pathological fracture: Secondary | ICD-10-CM | POA: Diagnosis not present

## 2017-10-06 DIAGNOSIS — M545 Low back pain: Secondary | ICD-10-CM | POA: Diagnosis not present

## 2017-10-10 DIAGNOSIS — M48061 Spinal stenosis, lumbar region without neurogenic claudication: Secondary | ICD-10-CM | POA: Diagnosis not present

## 2017-10-10 DIAGNOSIS — M545 Low back pain: Secondary | ICD-10-CM | POA: Diagnosis not present

## 2017-10-10 DIAGNOSIS — M5116 Intervertebral disc disorders with radiculopathy, lumbar region: Secondary | ICD-10-CM | POA: Diagnosis not present

## 2017-10-13 DIAGNOSIS — M545 Low back pain: Secondary | ICD-10-CM | POA: Diagnosis not present

## 2017-10-14 DIAGNOSIS — L03116 Cellulitis of left lower limb: Secondary | ICD-10-CM | POA: Diagnosis not present

## 2017-10-14 DIAGNOSIS — E118 Type 2 diabetes mellitus with unspecified complications: Secondary | ICD-10-CM | POA: Diagnosis not present

## 2017-10-14 DIAGNOSIS — M810 Age-related osteoporosis without current pathological fracture: Secondary | ICD-10-CM | POA: Diagnosis not present

## 2017-10-14 DIAGNOSIS — R0600 Dyspnea, unspecified: Secondary | ICD-10-CM | POA: Diagnosis not present

## 2017-10-14 DIAGNOSIS — K219 Gastro-esophageal reflux disease without esophagitis: Secondary | ICD-10-CM | POA: Diagnosis not present

## 2017-10-14 DIAGNOSIS — E1140 Type 2 diabetes mellitus with diabetic neuropathy, unspecified: Secondary | ICD-10-CM | POA: Diagnosis not present

## 2017-10-14 DIAGNOSIS — F3341 Major depressive disorder, recurrent, in partial remission: Secondary | ICD-10-CM | POA: Diagnosis not present

## 2017-10-14 DIAGNOSIS — R945 Abnormal results of liver function studies: Secondary | ICD-10-CM | POA: Diagnosis not present

## 2017-10-14 DIAGNOSIS — D692 Other nonthrombocytopenic purpura: Secondary | ICD-10-CM | POA: Diagnosis not present

## 2017-11-09 DIAGNOSIS — M47816 Spondylosis without myelopathy or radiculopathy, lumbar region: Secondary | ICD-10-CM | POA: Diagnosis not present

## 2017-11-09 DIAGNOSIS — E119 Type 2 diabetes mellitus without complications: Secondary | ICD-10-CM | POA: Diagnosis not present

## 2017-11-09 DIAGNOSIS — M5416 Radiculopathy, lumbar region: Secondary | ICD-10-CM | POA: Diagnosis not present

## 2017-11-09 DIAGNOSIS — M48061 Spinal stenosis, lumbar region without neurogenic claudication: Secondary | ICD-10-CM | POA: Diagnosis not present

## 2017-11-09 DIAGNOSIS — M170 Bilateral primary osteoarthritis of knee: Secondary | ICD-10-CM | POA: Diagnosis not present

## 2017-11-09 DIAGNOSIS — F419 Anxiety disorder, unspecified: Secondary | ICD-10-CM | POA: Diagnosis not present

## 2017-11-09 DIAGNOSIS — Z96651 Presence of right artificial knee joint: Secondary | ICD-10-CM | POA: Diagnosis not present

## 2017-11-09 DIAGNOSIS — G894 Chronic pain syndrome: Secondary | ICD-10-CM | POA: Diagnosis not present

## 2017-12-08 DIAGNOSIS — M5416 Radiculopathy, lumbar region: Secondary | ICD-10-CM | POA: Diagnosis not present

## 2017-12-08 DIAGNOSIS — M48061 Spinal stenosis, lumbar region without neurogenic claudication: Secondary | ICD-10-CM | POA: Diagnosis not present

## 2017-12-08 DIAGNOSIS — Z96651 Presence of right artificial knee joint: Secondary | ICD-10-CM | POA: Diagnosis not present

## 2017-12-08 DIAGNOSIS — F419 Anxiety disorder, unspecified: Secondary | ICD-10-CM | POA: Diagnosis not present

## 2017-12-08 DIAGNOSIS — M47816 Spondylosis without myelopathy or radiculopathy, lumbar region: Secondary | ICD-10-CM | POA: Diagnosis not present

## 2017-12-08 DIAGNOSIS — G894 Chronic pain syndrome: Secondary | ICD-10-CM | POA: Diagnosis not present

## 2017-12-08 DIAGNOSIS — E119 Type 2 diabetes mellitus without complications: Secondary | ICD-10-CM | POA: Diagnosis not present

## 2017-12-08 DIAGNOSIS — M170 Bilateral primary osteoarthritis of knee: Secondary | ICD-10-CM | POA: Diagnosis not present

## 2017-12-22 DIAGNOSIS — E7800 Pure hypercholesterolemia, unspecified: Secondary | ICD-10-CM | POA: Diagnosis not present

## 2017-12-22 DIAGNOSIS — M810 Age-related osteoporosis without current pathological fracture: Secondary | ICD-10-CM | POA: Diagnosis not present

## 2017-12-22 DIAGNOSIS — E118 Type 2 diabetes mellitus with unspecified complications: Secondary | ICD-10-CM | POA: Diagnosis not present

## 2017-12-22 DIAGNOSIS — R0600 Dyspnea, unspecified: Secondary | ICD-10-CM | POA: Diagnosis not present

## 2017-12-22 DIAGNOSIS — K219 Gastro-esophageal reflux disease without esophagitis: Secondary | ICD-10-CM | POA: Diagnosis not present

## 2017-12-22 DIAGNOSIS — E1140 Type 2 diabetes mellitus with diabetic neuropathy, unspecified: Secondary | ICD-10-CM | POA: Diagnosis not present

## 2017-12-22 DIAGNOSIS — N289 Disorder of kidney and ureter, unspecified: Secondary | ICD-10-CM | POA: Diagnosis not present

## 2017-12-22 DIAGNOSIS — F3341 Major depressive disorder, recurrent, in partial remission: Secondary | ICD-10-CM | POA: Diagnosis not present

## 2017-12-22 DIAGNOSIS — N184 Chronic kidney disease, stage 4 (severe): Secondary | ICD-10-CM | POA: Diagnosis not present

## 2017-12-22 DIAGNOSIS — R945 Abnormal results of liver function studies: Secondary | ICD-10-CM | POA: Diagnosis not present

## 2017-12-22 DIAGNOSIS — I10 Essential (primary) hypertension: Secondary | ICD-10-CM | POA: Diagnosis not present

## 2018-01-15 ENCOUNTER — Ambulatory Visit (INDEPENDENT_AMBULATORY_CARE_PROVIDER_SITE_OTHER): Payer: Medicare HMO

## 2018-01-15 ENCOUNTER — Encounter: Payer: Self-pay | Admitting: Sports Medicine

## 2018-01-15 ENCOUNTER — Telehealth: Payer: Self-pay | Admitting: *Deleted

## 2018-01-15 ENCOUNTER — Telehealth: Payer: Self-pay | Admitting: Sports Medicine

## 2018-01-15 ENCOUNTER — Ambulatory Visit: Payer: Medicare HMO | Admitting: Sports Medicine

## 2018-01-15 DIAGNOSIS — L97909 Non-pressure chronic ulcer of unspecified part of unspecified lower leg with unspecified severity: Secondary | ICD-10-CM

## 2018-01-15 DIAGNOSIS — M79605 Pain in left leg: Secondary | ICD-10-CM

## 2018-01-15 DIAGNOSIS — I83009 Varicose veins of unspecified lower extremity with ulcer of unspecified site: Secondary | ICD-10-CM | POA: Diagnosis not present

## 2018-01-15 DIAGNOSIS — M79671 Pain in right foot: Secondary | ICD-10-CM

## 2018-01-15 DIAGNOSIS — I890 Lymphedema, not elsewhere classified: Secondary | ICD-10-CM

## 2018-01-15 DIAGNOSIS — M10071 Idiopathic gout, right ankle and foot: Secondary | ICD-10-CM

## 2018-01-15 DIAGNOSIS — M79604 Pain in right leg: Secondary | ICD-10-CM | POA: Diagnosis not present

## 2018-01-15 MED ORDER — TRIAMCINOLONE ACETONIDE 10 MG/ML IJ SUSP
10.0000 mg | Freq: Once | INTRAMUSCULAR | Status: AC
  Administered 2018-01-15: 10 mg

## 2018-01-15 NOTE — Telephone Encounter (Signed)
Dorian Pod [facility] states they do accept pt's [employer] and perform lymphedema Management, and could begin therapy beginning of next week. Faxed required form, clinicals and demographics to [facility]

## 2018-01-15 NOTE — Telephone Encounter (Signed)
Dorian Pod with [facility] called requesting today's office visit notes be faxed to her once dictated. Stated we are referring pt to them. Fax number is [phone].

## 2018-01-15 NOTE — Progress Notes (Signed)
Subjective: Carla Robinson is a 84 y.o. female patient who presents to office for evaluation of Right foot pain. Patient complains of progressive pain especially over the last week at the second toe 8 out of 10 sharp pain states that the bedsheets can barely touch the toe and sometimes depends on the certain way she moves her foot is forced the pain that she has to the area has been using Tylenol with no improvement in the warmth redness and swelling of the toe.  Patient admits to a past history of gout.  Patient also states that she has blisters to both legs that has been there for the past month saw her primary care doctor Dr. Truman Hayward for these blisters but he said he does not know what is causing them and told her to use over-the-counter Neosporin states that there is a 5 out of 10 burning pain to her legs.  Patient denies nausea vomiting fever chills or any other constitutional symptoms at this time.  Patient reports that her blood sugar on yesterday was 150 she last saw her primary care doctor Dr. Truman Hayward 1 month ago and her last A1c is unknown.  No other issues noted at this time.   There are no active problems to display for this patient.   Current Outpatient Medications on File Prior to Visit  Medication Sig Dispense Refill  . allopurinol (ZYLOPRIM) 100 MG tablet Take 100 mg by mouth daily.    Marland Kitchen ALPRAZolam (XANAX) 0.5 MG tablet Take 0.5 mg by mouth at bedtime as needed for anxiety.    Marland Kitchen aspirin EC 325 MG tablet Take 325 mg by mouth daily.    Marland Kitchen atorvastatin (LIPITOR) 10 MG tablet Take 10 mg by mouth daily.    . colchicine 0.6 MG tablet Take 0.6 mg by mouth daily.    Marland Kitchen glucose blood test strip 1 each by Other route as needed for other. Use as instructed    . metoprolol tartrate (LOPRESSOR) 25 MG tablet Take 25 mg by mouth 2 (two) times daily.    . nabumetone (RELAFEN) 750 MG tablet Take 750 mg by mouth daily.    Marland Kitchen omeprazole (PRILOSEC) 20 MG capsule Take 20 mg by mouth daily.    . potassium  chloride SA (K-DUR,KLOR-CON) 20 MEQ tablet Take 20 mEq by mouth 2 (two) times daily.    . ranitidine (ZANTAC) 150 MG tablet Take 150 mg by mouth 2 (two) times daily.     No current facility-administered medications on file prior to visit.     Allergies  Allergen Reactions  . Sulfa Antibiotics     Objective:  General: Alert and oriented x3 in no acute distress  Dermatology: Focal Swelling, warmth, redness present on the right foot at the second toe, clear fluid-filled blisters to the anterior shin bilateral largest one measuring about 1 cm with trophic skin changes suggestive of lymphedema with multiple scaling patches consistent with signs of poor hygiene.  Nails are short and thickened but currently asymptomatic.  Vascular: Dorsalis Pedis and Posterior Tibial pedal pulses 0/4, Capillary Fill Time 5 seconds,(-) pedal hair growth bilateral,Temperature gradient increased over the Right second toe likely consistent with gout based on patient past history.  Neurology: Protective sensation diminished bilateral.  Musculoskeletal: There is tenderness with palpation at right second toe.  No pain to palpation to calf bilateral.  Pes planus foot type with Charcot deformity noted right greater than left.   Xrays  Right Foot    Impression:  Decreased osseous mineralization, there is diffuse arthritis and midfoot collapse supportive of likely Charcot deformity there is diffuse digital deformity with hammertoe present and mild soft tissue swelling especially noted over the right second toe.  There is no significant fracture deformity or acute findings or foreign body.  Assessment and Plan: Problem List Items Addressed This Visit    None    Visit Diagnoses    Right foot pain    -  Primary   Relevant Orders   DG Foot Complete Right   Acute idiopathic gout involving toe of right foot       Relevant Medications   triamcinolone acetonide (KENALOG) 10 MG/ML injection 10 mg (Completed)   Venous  ulcer (HCC)       Lymphedema       Leg pain, bilateral           -Complete examination performed -Xrays reviewed -Discussed treatement options for gouty arthritis and gout education provided - After oral consent, injected right second toe with 1cc lidocaine and marcaine plain mixed with 0.25cc Kenalog-10 and Dexmethasone phosphate without complication; post injection care explained. -Did not refill colchicine at this time because patient states that she previously had this and steroid by mouth and did not see any difference -Advised patient if pain is not better in 1 week to call office for [place] to order arthritic panel -Applied Silvadene cream covered with Ace wrap compression to the knees and ordered home nursing to assist with lymphedema management -Recommend elevation when sitting to assist with edema control and diet modification -Patient to return if symptoms are not better in 1 week or sooner if condition worsens.  Landis Martins, DPM

## 2018-01-15 NOTE — Telephone Encounter (Signed)
Unable to leave message requesting if established with a [facility] agency, voicemail requested remote access code.

## 2018-01-15 NOTE — Telephone Encounter (Signed)
-----   Message from [place], [place] sent at 01/15/2018  9:29 AM EST ----- Regarding: Home nursing Lymphedema management Apply silvadene cream to blisters on legs and ace wrap or compression to knees for edema control -Dr. Cannon Kettle

## 2018-01-18 ENCOUNTER — Telehealth: Payer: Self-pay | Admitting: *Deleted

## 2018-01-18 DIAGNOSIS — I872 Venous insufficiency (chronic) (peripheral): Secondary | ICD-10-CM | POA: Diagnosis not present

## 2018-01-18 DIAGNOSIS — L97819 Non-pressure chronic ulcer of other part of right lower leg with unspecified severity: Secondary | ICD-10-CM | POA: Diagnosis not present

## 2018-01-18 DIAGNOSIS — I890 Lymphedema, not elsewhere classified: Secondary | ICD-10-CM | POA: Diagnosis not present

## 2018-01-18 DIAGNOSIS — E119 Type 2 diabetes mellitus without complications: Secondary | ICD-10-CM | POA: Diagnosis not present

## 2018-01-18 DIAGNOSIS — Z7982 Long term (current) use of aspirin: Secondary | ICD-10-CM | POA: Diagnosis not present

## 2018-01-18 DIAGNOSIS — L97829 Non-pressure chronic ulcer of other part of left lower leg with unspecified severity: Secondary | ICD-10-CM | POA: Diagnosis not present

## 2018-01-18 MED ORDER — SILVER SULFADIAZINE 1 % EX CREA: 1.0000 "application " | TOPICAL_CREAM | Freq: Every day | CUTANEOUS | 5 refills | Status: DC

## 2018-01-18 NOTE — Telephone Encounter (Signed)
Suanne Marker [facility] states pt does not have Silvadene Cream to be applied prior to the lymphedema wraps. I reviewed Dr. Leeanne Rio orders and told Suanne Marker I would send the silvadene to the [facility]. Suanne Marker states she will wrap the legs with dry sterile dressings until seen Wednesday.

## 2018-01-20 DIAGNOSIS — L97829 Non-pressure chronic ulcer of other part of left lower leg with unspecified severity: Secondary | ICD-10-CM | POA: Diagnosis not present

## 2018-01-20 DIAGNOSIS — Z7982 Long term (current) use of aspirin: Secondary | ICD-10-CM | POA: Diagnosis not present

## 2018-01-20 DIAGNOSIS — I890 Lymphedema, not elsewhere classified: Secondary | ICD-10-CM | POA: Diagnosis not present

## 2018-01-20 DIAGNOSIS — L97819 Non-pressure chronic ulcer of other part of right lower leg with unspecified severity: Secondary | ICD-10-CM | POA: Diagnosis not present

## 2018-01-20 DIAGNOSIS — I872 Venous insufficiency (chronic) (peripheral): Secondary | ICD-10-CM | POA: Diagnosis not present

## 2018-01-20 DIAGNOSIS — E119 Type 2 diabetes mellitus without complications: Secondary | ICD-10-CM | POA: Diagnosis not present

## 2018-01-22 DIAGNOSIS — M810 Age-related osteoporosis without current pathological fracture: Secondary | ICD-10-CM | POA: Diagnosis not present

## 2018-01-22 DIAGNOSIS — L0390 Cellulitis, unspecified: Secondary | ICD-10-CM | POA: Diagnosis not present

## 2018-01-22 DIAGNOSIS — I872 Venous insufficiency (chronic) (peripheral): Secondary | ICD-10-CM | POA: Diagnosis not present

## 2018-01-22 DIAGNOSIS — Z7982 Long term (current) use of aspirin: Secondary | ICD-10-CM | POA: Diagnosis not present

## 2018-01-22 DIAGNOSIS — L97829 Non-pressure chronic ulcer of other part of left lower leg with unspecified severity: Secondary | ICD-10-CM | POA: Diagnosis not present

## 2018-01-22 DIAGNOSIS — E1140 Type 2 diabetes mellitus with diabetic neuropathy, unspecified: Secondary | ICD-10-CM | POA: Diagnosis not present

## 2018-01-22 DIAGNOSIS — K219 Gastro-esophageal reflux disease without esophagitis: Secondary | ICD-10-CM | POA: Diagnosis not present

## 2018-01-22 DIAGNOSIS — R0600 Dyspnea, unspecified: Secondary | ICD-10-CM | POA: Diagnosis not present

## 2018-01-22 DIAGNOSIS — R945 Abnormal results of liver function studies: Secondary | ICD-10-CM | POA: Diagnosis not present

## 2018-01-22 DIAGNOSIS — L97819 Non-pressure chronic ulcer of other part of right lower leg with unspecified severity: Secondary | ICD-10-CM | POA: Diagnosis not present

## 2018-01-22 DIAGNOSIS — E119 Type 2 diabetes mellitus without complications: Secondary | ICD-10-CM | POA: Diagnosis not present

## 2018-01-22 DIAGNOSIS — N289 Disorder of kidney and ureter, unspecified: Secondary | ICD-10-CM | POA: Diagnosis not present

## 2018-01-22 DIAGNOSIS — F3341 Major depressive disorder, recurrent, in partial remission: Secondary | ICD-10-CM | POA: Diagnosis not present

## 2018-01-22 DIAGNOSIS — I890 Lymphedema, not elsewhere classified: Secondary | ICD-10-CM | POA: Diagnosis not present

## 2018-01-22 DIAGNOSIS — N184 Chronic kidney disease, stage 4 (severe): Secondary | ICD-10-CM | POA: Diagnosis not present

## 2018-01-26 DIAGNOSIS — E119 Type 2 diabetes mellitus without complications: Secondary | ICD-10-CM | POA: Diagnosis not present

## 2018-01-26 DIAGNOSIS — I890 Lymphedema, not elsewhere classified: Secondary | ICD-10-CM | POA: Diagnosis not present

## 2018-01-26 DIAGNOSIS — L97819 Non-pressure chronic ulcer of other part of right lower leg with unspecified severity: Secondary | ICD-10-CM | POA: Diagnosis not present

## 2018-01-26 DIAGNOSIS — Z7982 Long term (current) use of aspirin: Secondary | ICD-10-CM | POA: Diagnosis not present

## 2018-01-26 DIAGNOSIS — I872 Venous insufficiency (chronic) (peripheral): Secondary | ICD-10-CM | POA: Diagnosis not present

## 2018-01-26 DIAGNOSIS — L97829 Non-pressure chronic ulcer of other part of left lower leg with unspecified severity: Secondary | ICD-10-CM | POA: Diagnosis not present

## 2018-01-27 DIAGNOSIS — I890 Lymphedema, not elsewhere classified: Secondary | ICD-10-CM | POA: Diagnosis not present

## 2018-01-27 DIAGNOSIS — I872 Venous insufficiency (chronic) (peripheral): Secondary | ICD-10-CM | POA: Diagnosis not present

## 2018-01-27 DIAGNOSIS — E119 Type 2 diabetes mellitus without complications: Secondary | ICD-10-CM | POA: Diagnosis not present

## 2018-01-27 DIAGNOSIS — L97829 Non-pressure chronic ulcer of other part of left lower leg with unspecified severity: Secondary | ICD-10-CM | POA: Diagnosis not present

## 2018-01-27 DIAGNOSIS — Z7982 Long term (current) use of aspirin: Secondary | ICD-10-CM | POA: Diagnosis not present

## 2018-01-27 DIAGNOSIS — L97819 Non-pressure chronic ulcer of other part of right lower leg with unspecified severity: Secondary | ICD-10-CM | POA: Diagnosis not present

## 2018-01-28 ENCOUNTER — Other Ambulatory Visit: Payer: Self-pay | Admitting: Sports Medicine

## 2018-01-28 DIAGNOSIS — L97909 Non-pressure chronic ulcer of unspecified part of unspecified lower leg with unspecified severity: Secondary | ICD-10-CM

## 2018-01-28 DIAGNOSIS — I890 Lymphedema, not elsewhere classified: Secondary | ICD-10-CM

## 2018-01-28 DIAGNOSIS — M79671 Pain in right foot: Secondary | ICD-10-CM

## 2018-01-28 DIAGNOSIS — I83009 Varicose veins of unspecified lower extremity with ulcer of unspecified site: Secondary | ICD-10-CM

## 2018-01-29 DIAGNOSIS — M109 Gout, unspecified: Secondary | ICD-10-CM | POA: Diagnosis not present

## 2018-01-29 DIAGNOSIS — I129 Hypertensive chronic kidney disease with stage 1 through stage 4 chronic kidney disease, or unspecified chronic kidney disease: Secondary | ICD-10-CM | POA: Diagnosis not present

## 2018-01-29 DIAGNOSIS — M069 Rheumatoid arthritis, unspecified: Secondary | ICD-10-CM | POA: Diagnosis not present

## 2018-01-29 DIAGNOSIS — E1122 Type 2 diabetes mellitus with diabetic chronic kidney disease: Secondary | ICD-10-CM | POA: Diagnosis not present

## 2018-01-29 DIAGNOSIS — M1990 Unspecified osteoarthritis, unspecified site: Secondary | ICD-10-CM | POA: Diagnosis not present

## 2018-01-29 DIAGNOSIS — L97425 Non-pressure chronic ulcer of left heel and midfoot with muscle involvement without evidence of necrosis: Secondary | ICD-10-CM | POA: Diagnosis not present

## 2018-01-29 DIAGNOSIS — N183 Chronic kidney disease, stage 3 (moderate): Secondary | ICD-10-CM | POA: Diagnosis not present

## 2018-01-29 DIAGNOSIS — E11621 Type 2 diabetes mellitus with foot ulcer: Secondary | ICD-10-CM | POA: Diagnosis not present

## 2018-02-01 DIAGNOSIS — E119 Type 2 diabetes mellitus without complications: Secondary | ICD-10-CM | POA: Diagnosis not present

## 2018-02-01 DIAGNOSIS — I872 Venous insufficiency (chronic) (peripheral): Secondary | ICD-10-CM | POA: Diagnosis not present

## 2018-02-01 DIAGNOSIS — Z7982 Long term (current) use of aspirin: Secondary | ICD-10-CM | POA: Diagnosis not present

## 2018-02-01 DIAGNOSIS — I890 Lymphedema, not elsewhere classified: Secondary | ICD-10-CM | POA: Diagnosis not present

## 2018-02-01 DIAGNOSIS — L97819 Non-pressure chronic ulcer of other part of right lower leg with unspecified severity: Secondary | ICD-10-CM | POA: Diagnosis not present

## 2018-02-01 DIAGNOSIS — L97829 Non-pressure chronic ulcer of other part of left lower leg with unspecified severity: Secondary | ICD-10-CM | POA: Diagnosis not present

## 2018-02-03 DIAGNOSIS — L97829 Non-pressure chronic ulcer of other part of left lower leg with unspecified severity: Secondary | ICD-10-CM | POA: Diagnosis not present

## 2018-02-03 DIAGNOSIS — L97819 Non-pressure chronic ulcer of other part of right lower leg with unspecified severity: Secondary | ICD-10-CM | POA: Diagnosis not present

## 2018-02-03 DIAGNOSIS — I872 Venous insufficiency (chronic) (peripheral): Secondary | ICD-10-CM | POA: Diagnosis not present

## 2018-02-03 DIAGNOSIS — Z7982 Long term (current) use of aspirin: Secondary | ICD-10-CM | POA: Diagnosis not present

## 2018-02-03 DIAGNOSIS — I890 Lymphedema, not elsewhere classified: Secondary | ICD-10-CM | POA: Diagnosis not present

## 2018-02-03 DIAGNOSIS — E119 Type 2 diabetes mellitus without complications: Secondary | ICD-10-CM | POA: Diagnosis not present

## 2018-02-05 DIAGNOSIS — E1122 Type 2 diabetes mellitus with diabetic chronic kidney disease: Secondary | ICD-10-CM | POA: Diagnosis not present

## 2018-02-05 DIAGNOSIS — L97422 Non-pressure chronic ulcer of left heel and midfoot with fat layer exposed: Secondary | ICD-10-CM | POA: Diagnosis not present

## 2018-02-05 DIAGNOSIS — I120 Hypertensive chronic kidney disease with stage 5 chronic kidney disease or end stage renal disease: Secondary | ICD-10-CM | POA: Diagnosis not present

## 2018-02-05 DIAGNOSIS — E1152 Type 2 diabetes mellitus with diabetic peripheral angiopathy with gangrene: Secondary | ICD-10-CM | POA: Diagnosis not present

## 2018-02-05 DIAGNOSIS — I96 Gangrene, not elsewhere classified: Secondary | ICD-10-CM | POA: Diagnosis not present

## 2018-02-05 DIAGNOSIS — Z992 Dependence on renal dialysis: Secondary | ICD-10-CM | POA: Diagnosis not present

## 2018-02-05 DIAGNOSIS — N186 End stage renal disease: Secondary | ICD-10-CM | POA: Diagnosis not present

## 2018-02-05 DIAGNOSIS — E11621 Type 2 diabetes mellitus with foot ulcer: Secondary | ICD-10-CM | POA: Diagnosis not present

## 2018-02-08 DIAGNOSIS — L97819 Non-pressure chronic ulcer of other part of right lower leg with unspecified severity: Secondary | ICD-10-CM | POA: Diagnosis not present

## 2018-02-08 DIAGNOSIS — I890 Lymphedema, not elsewhere classified: Secondary | ICD-10-CM | POA: Diagnosis not present

## 2018-02-08 DIAGNOSIS — L97829 Non-pressure chronic ulcer of other part of left lower leg with unspecified severity: Secondary | ICD-10-CM | POA: Diagnosis not present

## 2018-02-08 DIAGNOSIS — I872 Venous insufficiency (chronic) (peripheral): Secondary | ICD-10-CM | POA: Diagnosis not present

## 2018-02-08 DIAGNOSIS — Z7982 Long term (current) use of aspirin: Secondary | ICD-10-CM | POA: Diagnosis not present

## 2018-02-08 DIAGNOSIS — E119 Type 2 diabetes mellitus without complications: Secondary | ICD-10-CM | POA: Diagnosis not present

## 2018-02-09 DIAGNOSIS — E11621 Type 2 diabetes mellitus with foot ulcer: Secondary | ICD-10-CM | POA: Diagnosis not present

## 2018-02-09 DIAGNOSIS — M85862 Other specified disorders of bone density and structure, left lower leg: Secondary | ICD-10-CM | POA: Diagnosis not present

## 2018-02-09 DIAGNOSIS — L97509 Non-pressure chronic ulcer of other part of unspecified foot with unspecified severity: Secondary | ICD-10-CM | POA: Diagnosis not present

## 2018-02-09 DIAGNOSIS — L97429 Non-pressure chronic ulcer of left heel and midfoot with unspecified severity: Secondary | ICD-10-CM | POA: Diagnosis not present

## 2018-02-09 DIAGNOSIS — L97529 Non-pressure chronic ulcer of other part of left foot with unspecified severity: Secondary | ICD-10-CM | POA: Diagnosis not present

## 2018-02-10 DIAGNOSIS — I872 Venous insufficiency (chronic) (peripheral): Secondary | ICD-10-CM | POA: Diagnosis not present

## 2018-02-10 DIAGNOSIS — I890 Lymphedema, not elsewhere classified: Secondary | ICD-10-CM | POA: Diagnosis not present

## 2018-02-10 DIAGNOSIS — L97829 Non-pressure chronic ulcer of other part of left lower leg with unspecified severity: Secondary | ICD-10-CM | POA: Diagnosis not present

## 2018-02-10 DIAGNOSIS — Z7982 Long term (current) use of aspirin: Secondary | ICD-10-CM | POA: Diagnosis not present

## 2018-02-10 DIAGNOSIS — E119 Type 2 diabetes mellitus without complications: Secondary | ICD-10-CM | POA: Diagnosis not present

## 2018-02-10 DIAGNOSIS — L97819 Non-pressure chronic ulcer of other part of right lower leg with unspecified severity: Secondary | ICD-10-CM | POA: Diagnosis not present

## 2018-02-11 DIAGNOSIS — M7989 Other specified soft tissue disorders: Secondary | ICD-10-CM | POA: Diagnosis not present

## 2018-02-11 DIAGNOSIS — E11621 Type 2 diabetes mellitus with foot ulcer: Secondary | ICD-10-CM | POA: Diagnosis not present

## 2018-02-11 DIAGNOSIS — L97429 Non-pressure chronic ulcer of left heel and midfoot with unspecified severity: Secondary | ICD-10-CM | POA: Diagnosis not present

## 2018-02-12 DIAGNOSIS — E11621 Type 2 diabetes mellitus with foot ulcer: Secondary | ICD-10-CM | POA: Diagnosis not present

## 2018-02-12 DIAGNOSIS — N186 End stage renal disease: Secondary | ICD-10-CM | POA: Diagnosis not present

## 2018-02-12 DIAGNOSIS — L97422 Non-pressure chronic ulcer of left heel and midfoot with fat layer exposed: Secondary | ICD-10-CM | POA: Diagnosis not present

## 2018-02-12 DIAGNOSIS — E1122 Type 2 diabetes mellitus with diabetic chronic kidney disease: Secondary | ICD-10-CM | POA: Diagnosis not present

## 2018-02-12 DIAGNOSIS — Z992 Dependence on renal dialysis: Secondary | ICD-10-CM | POA: Diagnosis not present

## 2018-02-12 DIAGNOSIS — I120 Hypertensive chronic kidney disease with stage 5 chronic kidney disease or end stage renal disease: Secondary | ICD-10-CM | POA: Diagnosis not present

## 2018-02-17 DIAGNOSIS — Z7982 Long term (current) use of aspirin: Secondary | ICD-10-CM | POA: Diagnosis not present

## 2018-02-17 DIAGNOSIS — L97829 Non-pressure chronic ulcer of other part of left lower leg with unspecified severity: Secondary | ICD-10-CM | POA: Diagnosis not present

## 2018-02-17 DIAGNOSIS — E119 Type 2 diabetes mellitus without complications: Secondary | ICD-10-CM | POA: Diagnosis not present

## 2018-02-17 DIAGNOSIS — L97819 Non-pressure chronic ulcer of other part of right lower leg with unspecified severity: Secondary | ICD-10-CM | POA: Diagnosis not present

## 2018-02-17 DIAGNOSIS — I890 Lymphedema, not elsewhere classified: Secondary | ICD-10-CM | POA: Diagnosis not present

## 2018-02-17 DIAGNOSIS — I872 Venous insufficiency (chronic) (peripheral): Secondary | ICD-10-CM | POA: Diagnosis not present

## 2018-02-19 DIAGNOSIS — I120 Hypertensive chronic kidney disease with stage 5 chronic kidney disease or end stage renal disease: Secondary | ICD-10-CM | POA: Diagnosis not present

## 2018-02-19 DIAGNOSIS — N186 End stage renal disease: Secondary | ICD-10-CM | POA: Diagnosis not present

## 2018-02-19 DIAGNOSIS — E11621 Type 2 diabetes mellitus with foot ulcer: Secondary | ICD-10-CM | POA: Diagnosis not present

## 2018-02-19 DIAGNOSIS — E1122 Type 2 diabetes mellitus with diabetic chronic kidney disease: Secondary | ICD-10-CM | POA: Diagnosis not present

## 2018-02-19 DIAGNOSIS — Z992 Dependence on renal dialysis: Secondary | ICD-10-CM | POA: Diagnosis not present

## 2018-02-19 DIAGNOSIS — L97422 Non-pressure chronic ulcer of left heel and midfoot with fat layer exposed: Secondary | ICD-10-CM | POA: Diagnosis not present

## 2018-02-22 DIAGNOSIS — L97819 Non-pressure chronic ulcer of other part of right lower leg with unspecified severity: Secondary | ICD-10-CM | POA: Diagnosis not present

## 2018-02-22 DIAGNOSIS — E119 Type 2 diabetes mellitus without complications: Secondary | ICD-10-CM | POA: Diagnosis not present

## 2018-02-22 DIAGNOSIS — I890 Lymphedema, not elsewhere classified: Secondary | ICD-10-CM | POA: Diagnosis not present

## 2018-02-22 DIAGNOSIS — I872 Venous insufficiency (chronic) (peripheral): Secondary | ICD-10-CM | POA: Diagnosis not present

## 2018-02-22 DIAGNOSIS — Z7982 Long term (current) use of aspirin: Secondary | ICD-10-CM | POA: Diagnosis not present

## 2018-02-22 DIAGNOSIS — L97829 Non-pressure chronic ulcer of other part of left lower leg with unspecified severity: Secondary | ICD-10-CM | POA: Diagnosis not present

## 2018-02-24 DIAGNOSIS — L97829 Non-pressure chronic ulcer of other part of left lower leg with unspecified severity: Secondary | ICD-10-CM | POA: Diagnosis not present

## 2018-02-24 DIAGNOSIS — L97819 Non-pressure chronic ulcer of other part of right lower leg with unspecified severity: Secondary | ICD-10-CM | POA: Diagnosis not present

## 2018-02-24 DIAGNOSIS — I890 Lymphedema, not elsewhere classified: Secondary | ICD-10-CM | POA: Diagnosis not present

## 2018-02-24 DIAGNOSIS — E119 Type 2 diabetes mellitus without complications: Secondary | ICD-10-CM | POA: Diagnosis not present

## 2018-02-24 DIAGNOSIS — I872 Venous insufficiency (chronic) (peripheral): Secondary | ICD-10-CM | POA: Diagnosis not present

## 2018-02-24 DIAGNOSIS — Z7982 Long term (current) use of aspirin: Secondary | ICD-10-CM | POA: Diagnosis not present

## 2018-02-27 DIAGNOSIS — K219 Gastro-esophageal reflux disease without esophagitis: Secondary | ICD-10-CM | POA: Diagnosis not present

## 2018-02-27 DIAGNOSIS — R945 Abnormal results of liver function studies: Secondary | ICD-10-CM | POA: Diagnosis not present

## 2018-02-27 DIAGNOSIS — N289 Disorder of kidney and ureter, unspecified: Secondary | ICD-10-CM | POA: Diagnosis not present

## 2018-02-27 DIAGNOSIS — E1140 Type 2 diabetes mellitus with diabetic neuropathy, unspecified: Secondary | ICD-10-CM | POA: Diagnosis not present

## 2018-02-27 DIAGNOSIS — R600 Localized edema: Secondary | ICD-10-CM | POA: Diagnosis not present

## 2018-02-27 DIAGNOSIS — R0600 Dyspnea, unspecified: Secondary | ICD-10-CM | POA: Diagnosis not present

## 2018-02-27 DIAGNOSIS — N184 Chronic kidney disease, stage 4 (severe): Secondary | ICD-10-CM | POA: Diagnosis not present

## 2018-02-27 DIAGNOSIS — M810 Age-related osteoporosis without current pathological fracture: Secondary | ICD-10-CM | POA: Diagnosis not present

## 2018-02-27 DIAGNOSIS — F3341 Major depressive disorder, recurrent, in partial remission: Secondary | ICD-10-CM | POA: Diagnosis not present

## 2018-03-01 DIAGNOSIS — I872 Venous insufficiency (chronic) (peripheral): Secondary | ICD-10-CM | POA: Diagnosis not present

## 2018-03-01 DIAGNOSIS — L97819 Non-pressure chronic ulcer of other part of right lower leg with unspecified severity: Secondary | ICD-10-CM | POA: Diagnosis not present

## 2018-03-01 DIAGNOSIS — Z7982 Long term (current) use of aspirin: Secondary | ICD-10-CM | POA: Diagnosis not present

## 2018-03-01 DIAGNOSIS — E119 Type 2 diabetes mellitus without complications: Secondary | ICD-10-CM | POA: Diagnosis not present

## 2018-03-01 DIAGNOSIS — L97829 Non-pressure chronic ulcer of other part of left lower leg with unspecified severity: Secondary | ICD-10-CM | POA: Diagnosis not present

## 2018-03-01 DIAGNOSIS — I890 Lymphedema, not elsewhere classified: Secondary | ICD-10-CM | POA: Diagnosis not present

## 2018-03-03 DIAGNOSIS — I890 Lymphedema, not elsewhere classified: Secondary | ICD-10-CM | POA: Diagnosis not present

## 2018-03-03 DIAGNOSIS — Z7982 Long term (current) use of aspirin: Secondary | ICD-10-CM | POA: Diagnosis not present

## 2018-03-03 DIAGNOSIS — L97819 Non-pressure chronic ulcer of other part of right lower leg with unspecified severity: Secondary | ICD-10-CM | POA: Diagnosis not present

## 2018-03-03 DIAGNOSIS — E119 Type 2 diabetes mellitus without complications: Secondary | ICD-10-CM | POA: Diagnosis not present

## 2018-03-03 DIAGNOSIS — L97829 Non-pressure chronic ulcer of other part of left lower leg with unspecified severity: Secondary | ICD-10-CM | POA: Diagnosis not present

## 2018-03-03 DIAGNOSIS — I872 Venous insufficiency (chronic) (peripheral): Secondary | ICD-10-CM | POA: Diagnosis not present

## 2018-03-03 DIAGNOSIS — K219 Gastro-esophageal reflux disease without esophagitis: Secondary | ICD-10-CM | POA: Diagnosis not present

## 2018-03-03 DIAGNOSIS — N184 Chronic kidney disease, stage 4 (severe): Secondary | ICD-10-CM | POA: Diagnosis not present

## 2018-03-03 DIAGNOSIS — R600 Localized edema: Secondary | ICD-10-CM | POA: Diagnosis not present

## 2018-03-03 DIAGNOSIS — F419 Anxiety disorder, unspecified: Secondary | ICD-10-CM | POA: Diagnosis not present

## 2018-03-03 DIAGNOSIS — M810 Age-related osteoporosis without current pathological fracture: Secondary | ICD-10-CM | POA: Diagnosis not present

## 2018-03-03 DIAGNOSIS — N289 Disorder of kidney and ureter, unspecified: Secondary | ICD-10-CM | POA: Diagnosis not present

## 2018-03-03 DIAGNOSIS — R945 Abnormal results of liver function studies: Secondary | ICD-10-CM | POA: Diagnosis not present

## 2018-03-03 DIAGNOSIS — E1140 Type 2 diabetes mellitus with diabetic neuropathy, unspecified: Secondary | ICD-10-CM | POA: Diagnosis not present

## 2018-03-03 DIAGNOSIS — L0390 Cellulitis, unspecified: Secondary | ICD-10-CM | POA: Diagnosis not present

## 2018-03-08 DIAGNOSIS — I890 Lymphedema, not elsewhere classified: Secondary | ICD-10-CM | POA: Diagnosis not present

## 2018-03-08 DIAGNOSIS — L97829 Non-pressure chronic ulcer of other part of left lower leg with unspecified severity: Secondary | ICD-10-CM | POA: Diagnosis not present

## 2018-03-08 DIAGNOSIS — L97819 Non-pressure chronic ulcer of other part of right lower leg with unspecified severity: Secondary | ICD-10-CM | POA: Diagnosis not present

## 2018-03-08 DIAGNOSIS — I872 Venous insufficiency (chronic) (peripheral): Secondary | ICD-10-CM | POA: Diagnosis not present

## 2018-03-08 DIAGNOSIS — Z7982 Long term (current) use of aspirin: Secondary | ICD-10-CM | POA: Diagnosis not present

## 2018-03-08 DIAGNOSIS — E119 Type 2 diabetes mellitus without complications: Secondary | ICD-10-CM | POA: Diagnosis not present

## 2018-03-10 DIAGNOSIS — I890 Lymphedema, not elsewhere classified: Secondary | ICD-10-CM | POA: Diagnosis not present

## 2018-03-10 DIAGNOSIS — L97819 Non-pressure chronic ulcer of other part of right lower leg with unspecified severity: Secondary | ICD-10-CM | POA: Diagnosis not present

## 2018-03-10 DIAGNOSIS — I872 Venous insufficiency (chronic) (peripheral): Secondary | ICD-10-CM | POA: Diagnosis not present

## 2018-03-10 DIAGNOSIS — E119 Type 2 diabetes mellitus without complications: Secondary | ICD-10-CM | POA: Diagnosis not present

## 2018-03-10 DIAGNOSIS — Z7982 Long term (current) use of aspirin: Secondary | ICD-10-CM | POA: Diagnosis not present

## 2018-03-10 DIAGNOSIS — L97829 Non-pressure chronic ulcer of other part of left lower leg with unspecified severity: Secondary | ICD-10-CM | POA: Diagnosis not present

## 2018-03-11 DIAGNOSIS — I96 Gangrene, not elsewhere classified: Secondary | ICD-10-CM | POA: Diagnosis not present

## 2018-03-11 DIAGNOSIS — L97422 Non-pressure chronic ulcer of left heel and midfoot with fat layer exposed: Secondary | ICD-10-CM | POA: Diagnosis not present

## 2018-03-11 DIAGNOSIS — Z992 Dependence on renal dialysis: Secondary | ICD-10-CM | POA: Diagnosis not present

## 2018-03-11 DIAGNOSIS — I120 Hypertensive chronic kidney disease with stage 5 chronic kidney disease or end stage renal disease: Secondary | ICD-10-CM | POA: Diagnosis not present

## 2018-03-11 DIAGNOSIS — E11621 Type 2 diabetes mellitus with foot ulcer: Secondary | ICD-10-CM | POA: Diagnosis not present

## 2018-03-11 DIAGNOSIS — E1152 Type 2 diabetes mellitus with diabetic peripheral angiopathy with gangrene: Secondary | ICD-10-CM | POA: Diagnosis not present

## 2018-03-11 DIAGNOSIS — E1122 Type 2 diabetes mellitus with diabetic chronic kidney disease: Secondary | ICD-10-CM | POA: Diagnosis not present

## 2018-03-11 DIAGNOSIS — N186 End stage renal disease: Secondary | ICD-10-CM | POA: Diagnosis not present

## 2018-03-15 DIAGNOSIS — E119 Type 2 diabetes mellitus without complications: Secondary | ICD-10-CM | POA: Diagnosis not present

## 2018-03-15 DIAGNOSIS — I872 Venous insufficiency (chronic) (peripheral): Secondary | ICD-10-CM | POA: Diagnosis not present

## 2018-03-15 DIAGNOSIS — L97819 Non-pressure chronic ulcer of other part of right lower leg with unspecified severity: Secondary | ICD-10-CM | POA: Diagnosis not present

## 2018-03-15 DIAGNOSIS — Z7982 Long term (current) use of aspirin: Secondary | ICD-10-CM | POA: Diagnosis not present

## 2018-03-15 DIAGNOSIS — I890 Lymphedema, not elsewhere classified: Secondary | ICD-10-CM | POA: Diagnosis not present

## 2018-03-15 DIAGNOSIS — L97829 Non-pressure chronic ulcer of other part of left lower leg with unspecified severity: Secondary | ICD-10-CM | POA: Diagnosis not present

## 2018-03-17 DIAGNOSIS — E119 Type 2 diabetes mellitus without complications: Secondary | ICD-10-CM | POA: Diagnosis not present

## 2018-03-17 DIAGNOSIS — L97829 Non-pressure chronic ulcer of other part of left lower leg with unspecified severity: Secondary | ICD-10-CM | POA: Diagnosis not present

## 2018-03-17 DIAGNOSIS — Z7982 Long term (current) use of aspirin: Secondary | ICD-10-CM | POA: Diagnosis not present

## 2018-03-17 DIAGNOSIS — I872 Venous insufficiency (chronic) (peripheral): Secondary | ICD-10-CM | POA: Diagnosis not present

## 2018-03-17 DIAGNOSIS — I890 Lymphedema, not elsewhere classified: Secondary | ICD-10-CM | POA: Diagnosis not present

## 2018-03-17 DIAGNOSIS — L97819 Non-pressure chronic ulcer of other part of right lower leg with unspecified severity: Secondary | ICD-10-CM | POA: Diagnosis not present

## 2018-03-19 DIAGNOSIS — I890 Lymphedema, not elsewhere classified: Secondary | ICD-10-CM | POA: Diagnosis not present

## 2018-03-19 DIAGNOSIS — Z7982 Long term (current) use of aspirin: Secondary | ICD-10-CM | POA: Diagnosis not present

## 2018-03-19 DIAGNOSIS — L97829 Non-pressure chronic ulcer of other part of left lower leg with unspecified severity: Secondary | ICD-10-CM | POA: Diagnosis not present

## 2018-03-19 DIAGNOSIS — I872 Venous insufficiency (chronic) (peripheral): Secondary | ICD-10-CM | POA: Diagnosis not present

## 2018-03-19 DIAGNOSIS — E119 Type 2 diabetes mellitus without complications: Secondary | ICD-10-CM | POA: Diagnosis not present

## 2018-03-19 DIAGNOSIS — L97819 Non-pressure chronic ulcer of other part of right lower leg with unspecified severity: Secondary | ICD-10-CM | POA: Diagnosis not present

## 2018-03-22 DIAGNOSIS — I890 Lymphedema, not elsewhere classified: Secondary | ICD-10-CM | POA: Diagnosis not present

## 2018-03-22 DIAGNOSIS — E119 Type 2 diabetes mellitus without complications: Secondary | ICD-10-CM | POA: Diagnosis not present

## 2018-03-22 DIAGNOSIS — L97819 Non-pressure chronic ulcer of other part of right lower leg with unspecified severity: Secondary | ICD-10-CM | POA: Diagnosis not present

## 2018-03-22 DIAGNOSIS — I872 Venous insufficiency (chronic) (peripheral): Secondary | ICD-10-CM | POA: Diagnosis not present

## 2018-03-22 DIAGNOSIS — L97829 Non-pressure chronic ulcer of other part of left lower leg with unspecified severity: Secondary | ICD-10-CM | POA: Diagnosis not present

## 2018-03-22 DIAGNOSIS — Z7982 Long term (current) use of aspirin: Secondary | ICD-10-CM | POA: Diagnosis not present

## 2018-03-23 DIAGNOSIS — M79672 Pain in left foot: Secondary | ICD-10-CM | POA: Diagnosis not present

## 2018-03-23 DIAGNOSIS — E11621 Type 2 diabetes mellitus with foot ulcer: Secondary | ICD-10-CM | POA: Diagnosis not present

## 2018-03-23 DIAGNOSIS — L97429 Non-pressure chronic ulcer of left heel and midfoot with unspecified severity: Secondary | ICD-10-CM | POA: Diagnosis not present

## 2018-03-24 DIAGNOSIS — I872 Venous insufficiency (chronic) (peripheral): Secondary | ICD-10-CM | POA: Diagnosis not present

## 2018-03-24 DIAGNOSIS — I890 Lymphedema, not elsewhere classified: Secondary | ICD-10-CM | POA: Diagnosis not present

## 2018-03-24 DIAGNOSIS — L97829 Non-pressure chronic ulcer of other part of left lower leg with unspecified severity: Secondary | ICD-10-CM | POA: Diagnosis not present

## 2018-03-24 DIAGNOSIS — Z7982 Long term (current) use of aspirin: Secondary | ICD-10-CM | POA: Diagnosis not present

## 2018-03-24 DIAGNOSIS — E119 Type 2 diabetes mellitus without complications: Secondary | ICD-10-CM | POA: Diagnosis not present

## 2018-03-24 DIAGNOSIS — L97819 Non-pressure chronic ulcer of other part of right lower leg with unspecified severity: Secondary | ICD-10-CM | POA: Diagnosis not present

## 2018-03-26 DIAGNOSIS — Z7982 Long term (current) use of aspirin: Secondary | ICD-10-CM | POA: Diagnosis not present

## 2018-03-26 DIAGNOSIS — L97819 Non-pressure chronic ulcer of other part of right lower leg with unspecified severity: Secondary | ICD-10-CM | POA: Diagnosis not present

## 2018-03-26 DIAGNOSIS — I890 Lymphedema, not elsewhere classified: Secondary | ICD-10-CM | POA: Diagnosis not present

## 2018-03-26 DIAGNOSIS — I872 Venous insufficiency (chronic) (peripheral): Secondary | ICD-10-CM | POA: Diagnosis not present

## 2018-03-26 DIAGNOSIS — L97829 Non-pressure chronic ulcer of other part of left lower leg with unspecified severity: Secondary | ICD-10-CM | POA: Diagnosis not present

## 2018-03-26 DIAGNOSIS — E119 Type 2 diabetes mellitus without complications: Secondary | ICD-10-CM | POA: Diagnosis not present

## 2018-03-27 DIAGNOSIS — N289 Disorder of kidney and ureter, unspecified: Secondary | ICD-10-CM | POA: Diagnosis not present

## 2018-03-27 DIAGNOSIS — R945 Abnormal results of liver function studies: Secondary | ICD-10-CM | POA: Diagnosis not present

## 2018-03-27 DIAGNOSIS — R600 Localized edema: Secondary | ICD-10-CM | POA: Diagnosis not present

## 2018-03-27 DIAGNOSIS — M810 Age-related osteoporosis without current pathological fracture: Secondary | ICD-10-CM | POA: Diagnosis not present

## 2018-03-27 DIAGNOSIS — N184 Chronic kidney disease, stage 4 (severe): Secondary | ICD-10-CM | POA: Diagnosis not present

## 2018-03-27 DIAGNOSIS — F3341 Major depressive disorder, recurrent, in partial remission: Secondary | ICD-10-CM | POA: Diagnosis not present

## 2018-03-27 DIAGNOSIS — E1140 Type 2 diabetes mellitus with diabetic neuropathy, unspecified: Secondary | ICD-10-CM | POA: Diagnosis not present

## 2018-03-27 DIAGNOSIS — N3281 Overactive bladder: Secondary | ICD-10-CM | POA: Diagnosis not present

## 2018-03-27 DIAGNOSIS — K219 Gastro-esophageal reflux disease without esophagitis: Secondary | ICD-10-CM | POA: Diagnosis not present

## 2018-03-29 DIAGNOSIS — L97829 Non-pressure chronic ulcer of other part of left lower leg with unspecified severity: Secondary | ICD-10-CM | POA: Diagnosis not present

## 2018-03-29 DIAGNOSIS — L97819 Non-pressure chronic ulcer of other part of right lower leg with unspecified severity: Secondary | ICD-10-CM | POA: Diagnosis not present

## 2018-03-29 DIAGNOSIS — I872 Venous insufficiency (chronic) (peripheral): Secondary | ICD-10-CM | POA: Diagnosis not present

## 2018-03-29 DIAGNOSIS — Z7982 Long term (current) use of aspirin: Secondary | ICD-10-CM | POA: Diagnosis not present

## 2018-03-29 DIAGNOSIS — I890 Lymphedema, not elsewhere classified: Secondary | ICD-10-CM | POA: Diagnosis not present

## 2018-03-29 DIAGNOSIS — E119 Type 2 diabetes mellitus without complications: Secondary | ICD-10-CM | POA: Diagnosis not present

## 2018-03-31 DIAGNOSIS — E119 Type 2 diabetes mellitus without complications: Secondary | ICD-10-CM | POA: Diagnosis not present

## 2018-03-31 DIAGNOSIS — L97829 Non-pressure chronic ulcer of other part of left lower leg with unspecified severity: Secondary | ICD-10-CM | POA: Diagnosis not present

## 2018-03-31 DIAGNOSIS — I890 Lymphedema, not elsewhere classified: Secondary | ICD-10-CM | POA: Diagnosis not present

## 2018-03-31 DIAGNOSIS — L97819 Non-pressure chronic ulcer of other part of right lower leg with unspecified severity: Secondary | ICD-10-CM | POA: Diagnosis not present

## 2018-03-31 DIAGNOSIS — Z7982 Long term (current) use of aspirin: Secondary | ICD-10-CM | POA: Diagnosis not present

## 2018-03-31 DIAGNOSIS — I872 Venous insufficiency (chronic) (peripheral): Secondary | ICD-10-CM | POA: Diagnosis not present

## 2018-04-02 DIAGNOSIS — I890 Lymphedema, not elsewhere classified: Secondary | ICD-10-CM | POA: Diagnosis not present

## 2018-04-02 DIAGNOSIS — L97829 Non-pressure chronic ulcer of other part of left lower leg with unspecified severity: Secondary | ICD-10-CM | POA: Diagnosis not present

## 2018-04-02 DIAGNOSIS — E119 Type 2 diabetes mellitus without complications: Secondary | ICD-10-CM | POA: Diagnosis not present

## 2018-04-02 DIAGNOSIS — Z7982 Long term (current) use of aspirin: Secondary | ICD-10-CM | POA: Diagnosis not present

## 2018-04-02 DIAGNOSIS — L97819 Non-pressure chronic ulcer of other part of right lower leg with unspecified severity: Secondary | ICD-10-CM | POA: Diagnosis not present

## 2018-04-02 DIAGNOSIS — I872 Venous insufficiency (chronic) (peripheral): Secondary | ICD-10-CM | POA: Diagnosis not present

## 2018-04-05 DIAGNOSIS — L97829 Non-pressure chronic ulcer of other part of left lower leg with unspecified severity: Secondary | ICD-10-CM | POA: Diagnosis not present

## 2018-04-05 DIAGNOSIS — I890 Lymphedema, not elsewhere classified: Secondary | ICD-10-CM | POA: Diagnosis not present

## 2018-04-05 DIAGNOSIS — Z7982 Long term (current) use of aspirin: Secondary | ICD-10-CM | POA: Diagnosis not present

## 2018-04-05 DIAGNOSIS — L97819 Non-pressure chronic ulcer of other part of right lower leg with unspecified severity: Secondary | ICD-10-CM | POA: Diagnosis not present

## 2018-04-05 DIAGNOSIS — I872 Venous insufficiency (chronic) (peripheral): Secondary | ICD-10-CM | POA: Diagnosis not present

## 2018-04-05 DIAGNOSIS — E119 Type 2 diabetes mellitus without complications: Secondary | ICD-10-CM | POA: Diagnosis not present

## 2018-04-07 DIAGNOSIS — L97829 Non-pressure chronic ulcer of other part of left lower leg with unspecified severity: Secondary | ICD-10-CM | POA: Diagnosis not present

## 2018-04-07 DIAGNOSIS — I890 Lymphedema, not elsewhere classified: Secondary | ICD-10-CM | POA: Diagnosis not present

## 2018-04-07 DIAGNOSIS — E119 Type 2 diabetes mellitus without complications: Secondary | ICD-10-CM | POA: Diagnosis not present

## 2018-04-07 DIAGNOSIS — Z7982 Long term (current) use of aspirin: Secondary | ICD-10-CM | POA: Diagnosis not present

## 2018-04-07 DIAGNOSIS — L97819 Non-pressure chronic ulcer of other part of right lower leg with unspecified severity: Secondary | ICD-10-CM | POA: Diagnosis not present

## 2018-04-07 DIAGNOSIS — I872 Venous insufficiency (chronic) (peripheral): Secondary | ICD-10-CM | POA: Diagnosis not present

## 2018-04-09 DIAGNOSIS — Z7982 Long term (current) use of aspirin: Secondary | ICD-10-CM | POA: Diagnosis not present

## 2018-04-09 DIAGNOSIS — I872 Venous insufficiency (chronic) (peripheral): Secondary | ICD-10-CM | POA: Diagnosis not present

## 2018-04-09 DIAGNOSIS — E119 Type 2 diabetes mellitus without complications: Secondary | ICD-10-CM | POA: Diagnosis not present

## 2018-04-09 DIAGNOSIS — L97829 Non-pressure chronic ulcer of other part of left lower leg with unspecified severity: Secondary | ICD-10-CM | POA: Diagnosis not present

## 2018-04-09 DIAGNOSIS — L97819 Non-pressure chronic ulcer of other part of right lower leg with unspecified severity: Secondary | ICD-10-CM | POA: Diagnosis not present

## 2018-04-09 DIAGNOSIS — I890 Lymphedema, not elsewhere classified: Secondary | ICD-10-CM | POA: Diagnosis not present

## 2018-04-12 DIAGNOSIS — L97829 Non-pressure chronic ulcer of other part of left lower leg with unspecified severity: Secondary | ICD-10-CM | POA: Diagnosis not present

## 2018-04-12 DIAGNOSIS — I872 Venous insufficiency (chronic) (peripheral): Secondary | ICD-10-CM | POA: Diagnosis not present

## 2018-04-12 DIAGNOSIS — Z7982 Long term (current) use of aspirin: Secondary | ICD-10-CM | POA: Diagnosis not present

## 2018-04-12 DIAGNOSIS — E119 Type 2 diabetes mellitus without complications: Secondary | ICD-10-CM | POA: Diagnosis not present

## 2018-04-12 DIAGNOSIS — L97819 Non-pressure chronic ulcer of other part of right lower leg with unspecified severity: Secondary | ICD-10-CM | POA: Diagnosis not present

## 2018-04-12 DIAGNOSIS — I890 Lymphedema, not elsewhere classified: Secondary | ICD-10-CM | POA: Diagnosis not present

## 2018-04-13 DIAGNOSIS — E1140 Type 2 diabetes mellitus with diabetic neuropathy, unspecified: Secondary | ICD-10-CM | POA: Diagnosis not present

## 2018-04-13 DIAGNOSIS — M810 Age-related osteoporosis without current pathological fracture: Secondary | ICD-10-CM | POA: Diagnosis not present

## 2018-04-13 DIAGNOSIS — N289 Disorder of kidney and ureter, unspecified: Secondary | ICD-10-CM | POA: Diagnosis not present

## 2018-04-13 DIAGNOSIS — R945 Abnormal results of liver function studies: Secondary | ICD-10-CM | POA: Diagnosis not present

## 2018-04-13 DIAGNOSIS — F419 Anxiety disorder, unspecified: Secondary | ICD-10-CM | POA: Diagnosis not present

## 2018-04-13 DIAGNOSIS — K219 Gastro-esophageal reflux disease without esophagitis: Secondary | ICD-10-CM | POA: Diagnosis not present

## 2018-04-13 DIAGNOSIS — R600 Localized edema: Secondary | ICD-10-CM | POA: Diagnosis not present

## 2018-04-13 DIAGNOSIS — T7840XA Allergy, unspecified, initial encounter: Secondary | ICD-10-CM | POA: Diagnosis not present

## 2018-04-13 DIAGNOSIS — N184 Chronic kidney disease, stage 4 (severe): Secondary | ICD-10-CM | POA: Diagnosis not present

## 2018-04-16 DIAGNOSIS — Z7982 Long term (current) use of aspirin: Secondary | ICD-10-CM | POA: Diagnosis not present

## 2018-04-16 DIAGNOSIS — I872 Venous insufficiency (chronic) (peripheral): Secondary | ICD-10-CM | POA: Diagnosis not present

## 2018-04-16 DIAGNOSIS — I890 Lymphedema, not elsewhere classified: Secondary | ICD-10-CM | POA: Diagnosis not present

## 2018-04-16 DIAGNOSIS — L97829 Non-pressure chronic ulcer of other part of left lower leg with unspecified severity: Secondary | ICD-10-CM | POA: Diagnosis not present

## 2018-04-16 DIAGNOSIS — L97819 Non-pressure chronic ulcer of other part of right lower leg with unspecified severity: Secondary | ICD-10-CM | POA: Diagnosis not present

## 2018-04-16 DIAGNOSIS — E119 Type 2 diabetes mellitus without complications: Secondary | ICD-10-CM | POA: Diagnosis not present

## 2018-04-19 DIAGNOSIS — E119 Type 2 diabetes mellitus without complications: Secondary | ICD-10-CM | POA: Diagnosis not present

## 2018-04-19 DIAGNOSIS — I890 Lymphedema, not elsewhere classified: Secondary | ICD-10-CM | POA: Diagnosis not present

## 2018-04-19 DIAGNOSIS — I872 Venous insufficiency (chronic) (peripheral): Secondary | ICD-10-CM | POA: Diagnosis not present

## 2018-04-19 DIAGNOSIS — L97829 Non-pressure chronic ulcer of other part of left lower leg with unspecified severity: Secondary | ICD-10-CM | POA: Diagnosis not present

## 2018-04-19 DIAGNOSIS — Z7982 Long term (current) use of aspirin: Secondary | ICD-10-CM | POA: Diagnosis not present

## 2018-04-19 DIAGNOSIS — L97819 Non-pressure chronic ulcer of other part of right lower leg with unspecified severity: Secondary | ICD-10-CM | POA: Diagnosis not present

## 2018-04-20 DIAGNOSIS — I890 Lymphedema, not elsewhere classified: Secondary | ICD-10-CM | POA: Diagnosis not present

## 2018-04-20 DIAGNOSIS — I872 Venous insufficiency (chronic) (peripheral): Secondary | ICD-10-CM | POA: Diagnosis not present

## 2018-04-20 DIAGNOSIS — L97829 Non-pressure chronic ulcer of other part of left lower leg with unspecified severity: Secondary | ICD-10-CM | POA: Diagnosis not present

## 2018-04-20 DIAGNOSIS — Z7982 Long term (current) use of aspirin: Secondary | ICD-10-CM | POA: Diagnosis not present

## 2018-04-20 DIAGNOSIS — L97819 Non-pressure chronic ulcer of other part of right lower leg with unspecified severity: Secondary | ICD-10-CM | POA: Diagnosis not present

## 2018-04-20 DIAGNOSIS — E119 Type 2 diabetes mellitus without complications: Secondary | ICD-10-CM | POA: Diagnosis not present

## 2018-04-21 DIAGNOSIS — I10 Essential (primary) hypertension: Secondary | ICD-10-CM | POA: Diagnosis not present

## 2018-04-21 DIAGNOSIS — R945 Abnormal results of liver function studies: Secondary | ICD-10-CM | POA: Diagnosis not present

## 2018-04-21 DIAGNOSIS — R600 Localized edema: Secondary | ICD-10-CM | POA: Diagnosis not present

## 2018-04-21 DIAGNOSIS — N289 Disorder of kidney and ureter, unspecified: Secondary | ICD-10-CM | POA: Diagnosis not present

## 2018-04-21 DIAGNOSIS — E118 Type 2 diabetes mellitus with unspecified complications: Secondary | ICD-10-CM | POA: Diagnosis not present

## 2018-04-21 DIAGNOSIS — N184 Chronic kidney disease, stage 4 (severe): Secondary | ICD-10-CM | POA: Diagnosis not present

## 2018-04-21 DIAGNOSIS — M810 Age-related osteoporosis without current pathological fracture: Secondary | ICD-10-CM | POA: Diagnosis not present

## 2018-04-21 DIAGNOSIS — F3341 Major depressive disorder, recurrent, in partial remission: Secondary | ICD-10-CM | POA: Diagnosis not present

## 2018-04-21 DIAGNOSIS — I4891 Unspecified atrial fibrillation: Secondary | ICD-10-CM | POA: Diagnosis not present

## 2018-04-21 DIAGNOSIS — K219 Gastro-esophageal reflux disease without esophagitis: Secondary | ICD-10-CM | POA: Diagnosis not present

## 2018-04-21 DIAGNOSIS — E1140 Type 2 diabetes mellitus with diabetic neuropathy, unspecified: Secondary | ICD-10-CM | POA: Diagnosis not present

## 2018-04-21 DIAGNOSIS — E7800 Pure hypercholesterolemia, unspecified: Secondary | ICD-10-CM | POA: Diagnosis not present

## 2018-04-22 ENCOUNTER — Telehealth: Payer: Self-pay | Admitting: Cardiology

## 2018-04-22 DIAGNOSIS — I890 Lymphedema, not elsewhere classified: Secondary | ICD-10-CM | POA: Diagnosis not present

## 2018-04-22 DIAGNOSIS — I872 Venous insufficiency (chronic) (peripheral): Secondary | ICD-10-CM | POA: Diagnosis not present

## 2018-04-22 DIAGNOSIS — L97819 Non-pressure chronic ulcer of other part of right lower leg with unspecified severity: Secondary | ICD-10-CM | POA: Diagnosis not present

## 2018-04-22 DIAGNOSIS — L97829 Non-pressure chronic ulcer of other part of left lower leg with unspecified severity: Secondary | ICD-10-CM | POA: Diagnosis not present

## 2018-04-22 DIAGNOSIS — Z7982 Long term (current) use of aspirin: Secondary | ICD-10-CM | POA: Diagnosis not present

## 2018-04-22 DIAGNOSIS — E119 Type 2 diabetes mellitus without complications: Secondary | ICD-10-CM | POA: Diagnosis not present

## 2018-04-22 NOTE — Telephone Encounter (Signed)
Cardiac Questionnaire:    Since your last visit or hospitalization:    1. Have you been having new or worsening chest pain? no   2. Have you been having new or worsening shortness of breath? no 3. Have you been having new or worsening leg swelling, wt gain, or increase in abdominal girth (pants fitting more tightly)? no   4. Have you had any passing out spells? no    *A YES to any of these questions would result in the appointment being kept. *If all the answers to these questions are NO, we should indicate that given the current situation regarding the worldwide coronarvirus pandemic, at the recommendation of the CDC, we are looking to limit gatherings in our waiting area, and thus will reschedule their appointment beyond four weeks from today.   _____________   JIRCV-[id number] Pre-Screening Questions:   Do you currently have a fever? no (yes = cancel and refer to pcp for e-visit)  Have you recently travelled on a cruise, internationally, or to [place], [place], [place], [place], [place], or [place], [place]) ? no (yes = cancel, stay home, monitor symptoms, and contact pcp or initiate e-visit if symptoms develop)  Have you been in contact with someone that is currently pending confirmation of Covid19 testing or has been confirmed to have the [place] virus?  no (yes = cancel, stay home, away from tested individual, monitor symptoms, and contact pcp or initiate e-visit if symptoms develop)  Are you currently experiencing fatigue or cough? no (yes = pt should be prepared to have a mask placed at the time of their visit).         Virtual Visit Pre-Appointment Phone Call  Steps For Call:  1. Confirm consent - "In the setting of the current Covid19 crisis, you are scheduled for a (phone or video) visit with your provider on (date) at (time).  Just as we do with many in-office visits, in order for you to participate in this visit, we must obtain consent.  If you'd like, I can send this to your mychart (if  signed up) or email for you to review.  Otherwise, I can obtain your verbal consent now.  All virtual visits are billed to your insurance company just like a normal visit would be.  By agreeing to a virtual visit, we'd like you to understand that the technology does not allow for your provider to perform an examination, and thus may limit your provider's ability to fully assess your condition.  Finally, though the technology is pretty good, we cannot assure that it will always work on either your or our end, and in the setting of a video visit, we may have to convert it to a phone-only visit.  In either situation, we cannot ensure that we have a secure connection.  Are you willing to proceed?"  2. Give patient instructions for WebEx download to smartphone as below if video visit  3. Advise patient to be prepared with any vital sign or heart rhythm information, their current medicines, and a piece of paper and pen handy for any instructions they may receive the day of their visit  4. Inform patient they will receive a phone call 15 minutes prior to their appointment time (may be from unknown caller ID) so they should be prepared to answer  5. Confirm that appointment type is correct in Epic appointment notes (video vs telephone)    TELEPHONE CALL NOTE  Carla Robinson has been deemed a candidate for  a follow-up tele-health visit to limit community exposure during the Covid-19 pandemic. I spoke with the patient via phone to ensure availability of phone/video source, confirm preferred email & phone number, and discuss instructions and expectations.  I reminded Carla Robinson to be prepared with any vital sign and/or heart rhythm information that could potentially be obtained via home monitoring, at the time of her visit. I reminded Carla Robinson to expect a phone call at the time of her visit if her visit.  Did the patient verbally acknowledge consent to treatment? YES  Isaiah Blakes 04/22/2018 4:45 PM   DOWNLOADING THE [place], go to [employer] and type in WebEx in the search bar. [place] [employer], the blue/green circle. The app is free but as with any other app downloads, their phone may require them to verify saved payment information or Apple password. The patient does NOT have to create an account.  - If Android, ask patient to go to [employer] and type in WebEx in the search bar. [place] [employer], the blue/green circle. The app is free but as with any other app downloads, their phone may require them to verify saved payment information or Android password. The patient does NOT have to create an account.   CONSENT FOR TELE-HEALTH VISIT - PLEASE REVIEW  I hereby voluntarily request, consent and authorize [facility] and its employed or contracted physicians, physician assistants, nurse practitioners or other licensed health care professionals (the Practitioner), to provide me with telemedicine health care services (the Services") as deemed necessary by the treating Practitioner. I acknowledge and consent to receive the Services by the Practitioner via telemedicine. I understand that the telemedicine visit will involve communicating with the Practitioner through live audiovisual communication technology and the disclosure of certain medical information by electronic transmission. I acknowledge that I have been given the opportunity to request an in-person assessment or other available alternative prior to the telemedicine visit and am voluntarily participating in the telemedicine visit.  I understand that I have the right to withhold or withdraw my consent to the use of telemedicine in the course of my care at any time, without affecting my right to future care or treatment, and that the Practitioner or I may terminate the telemedicine visit at any time. I understand that I have the right to inspect  all information obtained and/or recorded in the course of the telemedicine visit and may receive copies of available information for a reasonable fee.  I understand that some of the potential risks of receiving the Services via telemedicine include:   Delay or interruption in medical evaluation due to technological equipment failure or disruption;  Information transmitted may not be sufficient (e.g. poor resolution of images) to allow for appropriate medical decision making by the Practitioner; and/or   In rare instances, security protocols could fail, causing a breach of personal health information.  Furthermore, I acknowledge that it is my responsibility to provide information about my medical history, conditions and care that is complete and accurate to the best of my ability. I acknowledge that Practitioner's advice, recommendations, and/or decision may be based on factors not within their control, such as incomplete or inaccurate data provided by me or distortions of diagnostic images or specimens that may result from electronic transmissions. I understand that the practice of medicine is not an exact science and that Practitioner makes no warranties or guarantees regarding treatment outcomes. I  acknowledge that I will receive a copy of this consent concurrently upon execution via email to the email address I last provided but may also request a printed copy by calling the office of [facility].    I understand that my insurance will be billed for this visit.   I have read or had this consent read to me.  I understand the contents of this consent, which adequately explains the benefits and risks of the Services being provided via telemedicine.   I have been provided ample opportunity to ask questions regarding this consent and the Services and have had my questions answered to my satisfaction.  I give my informed consent for the services to be provided through the use of telemedicine in my  medical care  By participating in this telemedicine visit I agree to the above.

## 2018-04-23 ENCOUNTER — Other Ambulatory Visit: Payer: Self-pay

## 2018-04-26 NOTE — Progress Notes (Signed)
cancelled

## 2018-04-27 DIAGNOSIS — R600 Localized edema: Secondary | ICD-10-CM | POA: Diagnosis not present

## 2018-04-27 DIAGNOSIS — L97829 Non-pressure chronic ulcer of other part of left lower leg with unspecified severity: Secondary | ICD-10-CM | POA: Diagnosis not present

## 2018-04-27 DIAGNOSIS — L97819 Non-pressure chronic ulcer of other part of right lower leg with unspecified severity: Secondary | ICD-10-CM | POA: Diagnosis not present

## 2018-04-27 DIAGNOSIS — N289 Disorder of kidney and ureter, unspecified: Secondary | ICD-10-CM | POA: Diagnosis not present

## 2018-04-27 DIAGNOSIS — I1310 Hypertensive heart and chronic kidney disease without heart failure, with stage 1 through stage 4 chronic kidney disease, or unspecified chronic kidney disease: Secondary | ICD-10-CM | POA: Insufficient documentation

## 2018-04-27 DIAGNOSIS — Z7982 Long term (current) use of aspirin: Secondary | ICD-10-CM | POA: Diagnosis not present

## 2018-04-27 DIAGNOSIS — I872 Venous insufficiency (chronic) (peripheral): Secondary | ICD-10-CM | POA: Diagnosis not present

## 2018-04-27 DIAGNOSIS — I4819 Other persistent atrial fibrillation: Secondary | ICD-10-CM | POA: Insufficient documentation

## 2018-04-27 DIAGNOSIS — E119 Type 2 diabetes mellitus without complications: Secondary | ICD-10-CM | POA: Diagnosis not present

## 2018-04-27 DIAGNOSIS — M109 Gout, unspecified: Secondary | ICD-10-CM | POA: Diagnosis not present

## 2018-04-27 DIAGNOSIS — K219 Gastro-esophageal reflux disease without esophagitis: Secondary | ICD-10-CM | POA: Diagnosis not present

## 2018-04-27 DIAGNOSIS — E1140 Type 2 diabetes mellitus with diabetic neuropathy, unspecified: Secondary | ICD-10-CM | POA: Diagnosis not present

## 2018-04-27 DIAGNOSIS — I890 Lymphedema, not elsewhere classified: Secondary | ICD-10-CM | POA: Diagnosis not present

## 2018-04-27 DIAGNOSIS — E785 Hyperlipidemia, unspecified: Secondary | ICD-10-CM | POA: Insufficient documentation

## 2018-04-27 DIAGNOSIS — M810 Age-related osteoporosis without current pathological fracture: Secondary | ICD-10-CM | POA: Diagnosis not present

## 2018-04-27 DIAGNOSIS — N184 Chronic kidney disease, stage 4 (severe): Secondary | ICD-10-CM | POA: Diagnosis not present

## 2018-04-27 DIAGNOSIS — F419 Anxiety disorder, unspecified: Secondary | ICD-10-CM | POA: Diagnosis not present

## 2018-04-27 DIAGNOSIS — R945 Abnormal results of liver function studies: Secondary | ICD-10-CM | POA: Diagnosis not present

## 2018-04-28 ENCOUNTER — Other Ambulatory Visit: Payer: Self-pay

## 2018-04-28 ENCOUNTER — Telehealth: Payer: No Typology Code available for payment source | Admitting: Cardiology

## 2018-04-28 NOTE — Progress Notes (Signed)
Virtual Visit via Video Note   This visit type was conducted due to national recommendations for restrictions regarding the COVID-19 Pandemic (e.g. social distancing) in an effort to limit this patient's exposure and mitigate transmission in our community.  Due to her co-morbid illnesses, this patient is at least at moderate risk for complications without adequate follow up.  This format is felt to be most appropriate for this patient at this time.  All issues noted in this document were discussed and addressed.  A limited physical exam was performed with this format.  Please refer to the patient's chart for her consent to telehealth for [facility].   Evaluation Performed:  Follow-up visit  Date:  04/28/2018   ID:  Carla Robinson, DOB [birth year], MRN [id number]  Patient Location: Home Provider Location: Office  PCP:  Cher Nakai, MD  Cardiologist:  No primary care provider on file. Dr Bettina Gavia Electrophysiologist:  None   Chief Complaint:  New atrial fibrillation   History of Present Illness:    Carla Robinson is a 84 y.o. female with newly recognized atrial fibrillation CHADS2 vasc score =5 moderate stroke risk referred by Dr. Truman Hayward. She was found to have atrial fibrillation during a routine office visit 04/21/2018.  I requested and just received a copy of that EKG which shows atrial fibrillation low voltage controlled ventricular rate 95 bpm poor R wave progression and nonspecific ST abnormality.  She has a history of peptic ulcer disease and takes both PPI and H2 blocker.  We attempted a video visit but she has no access to the Internet and neither the patient nor her family have a smart phone.  She wore a Holter monitor in July 2019 at [facility] cardiology that did not show arrhythmia.  She tells me she is in a drug study and as part of it she has EKGs done Dr. Marguerita Beards office and now was found to be in atrial fibrillation.  She has no awareness has had no palpitation syncope chest pain or  TIA.  She has chronic peripheral edema and draining wounds on her legs and is on a loop diuretic and has home health care.  She is unaware of a diagnosis of congestive heart failure.  Unfortunately she is unable to reduce salt in her diet as much of her food is prepared commercial or canned but does not add salt.  She does not weigh daily she has some exertional shortness of breath but struggles because of chronic back pain and uses a walker but is not having orthopnea or PND.  Her record says she has a history of peptic ulcer disease she was unaware and tells me she is never had GI bleeding.  She has no known history of congenital or rheumatic heart disease.  The patient does not have symptoms concerning for COVID-19 infection (fever, chills, cough, or new shortness of breath).    Past Medical History:  Diagnosis Date  . Anxiety   . Arthritis   . Cancer ([place])   . Chronic kidney disease   . Depression   . Diabetes mellitus without complication ([place])   . GERD (gastroesophageal reflux disease)   . Hyperlipidemia   . Hypertension   . Ulcer    Past Surgical History:  Procedure Laterality Date  . MOHS SURGERY       No outpatient medications have been marked as taking for the 04/29/18 encounter (Appointment) with Richardo Priest, MD.     Allergies:   Codeine; Doxycycline;  and Sulfa antibiotics   Social History   Tobacco Use  . Smoking status: Never Smoker  . Smokeless tobacco: Never Used  Substance Use Topics  . Alcohol use: No  . Drug use: No     Family Hx: The patient's family history includes CAD in her father and mother; Hypertension in her brother, father, mother, and sister.  ROS:   Please see the history of present illness.    Review of Systems  Constitution: Positive for malaise/fatigue.  HENT: Negative.   Eyes: Negative.   Cardiovascular: Positive for leg swelling.  Respiratory: Positive for shortness of breath.   Endocrine: Negative.   Hematologic/Lymphatic:  Negative.   Skin: Positive for poor wound healing.  Musculoskeletal: Positive for back pain and muscle weakness.  Gastrointestinal: Negative.   Genitourinary: Negative.   Neurological: Positive for loss of balance (uses a walker).  Psychiatric/Behavioral: Negative.   Allergic/Immunologic: Negative.    All other systems reviewed and are negative.   Prior CV studies:   The following studies were reviewed today:  See below for EKG and Holter monitor and labs  Labs/Other Tests and Data Reviewed:    EKG:  An ECG dated 04/21/18 was personally reviewed today and demonstrated:   atrial fibrillation low voltage controlled ventricular rate 95 bpm poor R wave progression and nonspecific ST abnormality.  Holter monitor July 2019 at [facility] cardiology:  Impressions Performed At  Normal sinus rhythm, sinus arrhythmia, sinus bradycardia, sinus tachycardia. No atrial fibrillation appreciated       Recent Labs: 04/21/2018 hemoglobin A1c 5.9% CMP abnormalities BUN 46 creatinine 2.52 GFR 17 cc/min liver function is normal cholesterol 126 HDL 30 LDL 60 hemoglobin 11.7 platelets 195,000   Wt Readings from Last 3 Encounters:  No data found for Wt     Objective:    Vital Signs:  There were no vitals taken for this visit.   Her thought process was clear she is alert and oriented, well developed female in no acute distress.   ASSESSMENT & PLAN:    1. Persistent atrial fibrillation asymptomatic I am uncertain about rate control will apply a 3-day ambulatory heart rhythm monitor when she comes the office for an echocardiogram.  With hypertension and marked edema certainly is at risk for cardiomyopathy and even during COVID-19 I think we need to bring her in doing office echocardiogram.  With her severe CKD approaching stage V I am not can alter her diuretics at this time.  We had a nice discussion about the benefits and risk of anticoagulant versus antiplatelet and will discontinue aspirin and  initiate reduced dose Eliquis with age and renal function.  Fortunately she does not fall at home. 2. Stable hypertension and CKD managed by her PCP she is approaching stage V and likely will need referral to nephrology in the near future 3. See above, I am not can alter antihypertensives at this time 4. Stable hyperlipidemia lipids are ideal continue her statin  COVID-19 Education: The signs and symptoms of COVID-19 were discussed with the patient and how to seek care for testing (follow up with PCP or arrange E-visit).  The importance of social distancing was discussed today.  Time:   Today, I have spent 35 minutes with the patient with telehealth technology discussing the above problems.     Medication Adjustments/Labs and Tests Ordered: Current medicines are reviewed at length with the patient today.  Concerns regarding medicines are outlined above.   Tests Ordered: No orders of the defined types were  placed in this encounter.   Medication Changes: No orders of the defined types were placed in this encounter.   Disposition:  Follow up 4 weeks  Signed, Shirlee More, MD  04/28/2018 5:45 PM    [place]

## 2018-04-29 ENCOUNTER — Telehealth (INDEPENDENT_AMBULATORY_CARE_PROVIDER_SITE_OTHER): Payer: Medicare HMO | Admitting: Cardiology

## 2018-04-29 ENCOUNTER — Other Ambulatory Visit: Payer: No Typology Code available for payment source

## 2018-04-29 ENCOUNTER — Encounter: Payer: Self-pay | Admitting: Cardiology

## 2018-04-29 VITALS — BP 116/57 | HR 90 | Ht 62.0 in | Wt 210.0 lb

## 2018-04-29 DIAGNOSIS — L97829 Non-pressure chronic ulcer of other part of left lower leg with unspecified severity: Secondary | ICD-10-CM | POA: Diagnosis not present

## 2018-04-29 DIAGNOSIS — N184 Chronic kidney disease, stage 4 (severe): Secondary | ICD-10-CM

## 2018-04-29 DIAGNOSIS — L97819 Non-pressure chronic ulcer of other part of right lower leg with unspecified severity: Secondary | ICD-10-CM | POA: Diagnosis not present

## 2018-04-29 DIAGNOSIS — I872 Venous insufficiency (chronic) (peripheral): Secondary | ICD-10-CM | POA: Diagnosis not present

## 2018-04-29 DIAGNOSIS — I1310 Hypertensive heart and chronic kidney disease without heart failure, with stage 1 through stage 4 chronic kidney disease, or unspecified chronic kidney disease: Secondary | ICD-10-CM

## 2018-04-29 DIAGNOSIS — E782 Mixed hyperlipidemia: Secondary | ICD-10-CM

## 2018-04-29 DIAGNOSIS — I4819 Other persistent atrial fibrillation: Secondary | ICD-10-CM

## 2018-04-29 DIAGNOSIS — Z7189 Other specified counseling: Secondary | ICD-10-CM

## 2018-04-29 DIAGNOSIS — Z7982 Long term (current) use of aspirin: Secondary | ICD-10-CM | POA: Diagnosis not present

## 2018-04-29 DIAGNOSIS — E119 Type 2 diabetes mellitus without complications: Secondary | ICD-10-CM | POA: Diagnosis not present

## 2018-04-29 DIAGNOSIS — I890 Lymphedema, not elsewhere classified: Secondary | ICD-10-CM | POA: Diagnosis not present

## 2018-04-29 MED ORDER — APIXABAN 2.5 MG PO TABS: 2.5000 mg | ORAL_TABLET | Freq: Two times a day (BID) | ORAL | 3 refills | Status: DC

## 2018-04-29 NOTE — Patient Instructions (Signed)
Medication Instructions:  Your physician has recommended you make the following change in your medication:  START: Eliquis 2.5mg  (1 tab) twice daily STOP: Aspirin  If you need a refill on your cardiac medications before your next appointment, please call your pharmacy.   Lab work: None If you have labs (blood work) drawn today and your tests are completely normal, you will receive your results only by:  [place] (if you have MyChart) OR  A paper copy in the mail If you have any lab test that is abnormal or we need to change your treatment, we will call you to review the results.  Testing/Procedures: Your physician has requested that you have an echocardiogram. Echocardiography is a painless test that uses sound waves to create images of your heart. It provides your doctor with information about the size and shape of your heart and how well your hearts chambers and valves are working. This procedure takes approximately one hour. There are no restrictions for this procedure.   Echocardiogram An echocardiogram is a procedure that uses painless sound waves (ultrasound) to produce an image of the heart. Images from an echocardiogram can provide important information about:  Signs of coronary artery disease (CAD).  Aneurysm detection. An aneurysm is a weak or damaged part of an artery wall that bulges out from the normal force of blood pumping through the body.  Heart size and shape. Changes in the size or shape of the heart can be associated with certain conditions, including heart failure, aneurysm, and CAD.  Heart muscle function.  Heart valve function.  Signs of a past heart attack.  Fluid buildup around the heart.  Thickening of the heart muscle.  A tumor or infectious growth around the heart valves. Tell a health care provider about:  Any allergies you have.  All medicines you are taking, including vitamins, herbs, eye drops, creams, and over-the-counter  medicines.  Any blood disorders you have.  Any surgeries you have had.  Any medical conditions you have.  Whether you are pregnant or may be pregnant. What are the risks? Generally, this is a safe procedure. However, problems may occur, including:  Allergic reaction to dye (contrast) that may be used during the procedure. What happens before the procedure? No specific preparation is needed. You may eat and drink normally. What happens during the procedure?   An IV tube may be inserted into one of your veins.  You may receive contrast through this tube. A contrast is an injection that improves the quality of the pictures from your heart.  A gel will be applied to your chest.  A wand-like tool (transducer) will be moved over your chest. The gel will help to transmit the sound waves from the transducer.  The sound waves will harmlessly bounce off of your heart to allow the heart images to be captured in real-time motion. The images will be recorded on a computer. The procedure may vary among health care providers and hospitals. What happens after the procedure?  You may return to your normal, everyday life, including diet, activities, and medicines, unless your health care provider tells you not to do that. Summary  An echocardiogram is a procedure that uses painless sound waves (ultrasound) to produce an image of the heart.  Images from an echocardiogram can provide important information about the size and shape of your heart, heart muscle function, heart valve function, and fluid buildup around your heart.  You do not need to do anything to prepare before  this procedure. You may eat and drink normally.  After the echocardiogram is completed, you may return to your normal, everyday life, unless your health care provider tells you not to do that. This information is not intended to replace advice given to you by your health care provider. Make sure you discuss any questions you  have with your health care provider. Document Released: 12/28/1999 Document Revised: 02/02/2016 Document Reviewed: 02/02/2016 Elsevier Interactive Patient Education  2019 [employer].   Follow-Up: At [facility], you and your health needs are our priority.  As part of our continuing mission to provide you with exceptional heart care, we have created designated Provider Care Teams.  These Care Teams include your primary Cardiologist (physician) and Advanced Practice Providers (APPs -  Physician Assistants and Nurse Practitioners) who all work together to provide you with the care you need, when you need it. You will need a follow up appointment in: we will call you after the Echo results Any Other Special Instructions Will Be Listed Below (If Applicable).  Apixaban oral tablets What is this medicine? APIXABAN (a PIX a ban) is an anticoagulant (blood thinner). It is used to lower the chance of stroke in people with a medical condition called atrial fibrillation. It is also used to treat or prevent blood clots in the lungs or in the veins. This medicine may be used for other purposes; ask your health care provider or pharmacist if you have questions. COMMON BRAND NAME(S): Eliquis What should I tell my health care provider before I take this medicine? They need to know if you have any of these conditions: -bleeding disorders -bleeding in the brain -blood in your stools (black or tarry stools) or if you have blood in your vomit -history of stomach bleeding -kidney disease -liver disease -mechanical heart valve -an unusual or allergic reaction to apixaban, other medicines, foods, dyes, or preservatives -pregnant or trying to get pregnant -breast-feeding How should I use this medicine? Take this medicine by mouth with a glass of water. Follow the directions on the prescription label. You can take it with or without food. If it upsets your stomach, take it with food. Take your medicine at  regular intervals. Do not take it more often than directed. Do not stop taking except on your doctor's advice. Stopping this medicine may increase your risk of a blood clot. Be sure to refill your prescription before you run out of medicine. Talk to your pediatrician regarding the use of this medicine in children. Special care may be needed. Overdosage: If you think you have taken too much of this medicine contact a poison control center or emergency room at once. NOTE: This medicine is only for you. Do not share this medicine with others. What if I miss a dose? If you miss a dose, take it as soon as you can. If it is almost time for your next dose, take only that dose. Do not take double or extra doses. What may interact with this medicine? This medicine may interact with the following: -aspirin and aspirin-like medicines -certain medicines for fungal infections like ketoconazole and itraconazole -certain medicines for seizures like carbamazepine and phenytoin -certain medicines that treat or prevent blood clots like warfarin, enoxaparin, and dalteparin -clarithromycin -NSAIDs, medicines for pain and inflammation, like ibuprofen or naproxen -rifampin -ritonavir -St. John's wort This list may not describe all possible interactions. Give your health care provider a list of all the medicines, herbs, non-prescription drugs, or dietary supplements you use. Also  tell them if you smoke, drink alcohol, or use illegal drugs. Some items may interact with your medicine. What should I watch for while using this medicine? Visit your healthcare professional for regular checks on your progress. You may need blood work done while you are taking this medicine. Your condition will be monitored carefully while you are receiving this medicine. It is important not to miss any appointments. Avoid sports and activities that might cause injury while you are using this medicine. Severe falls or injuries can cause unseen  bleeding. Be careful when using sharp tools or knives. Consider using an Copy. Take special care brushing or flossing your teeth. Report any injuries, bruising, or red spots on the skin to your healthcare professional. If you are going to need surgery or other procedure, tell your healthcare professional that you are taking this medicine. Wear a medical ID bracelet or chain. Carry a card that describes your disease and details of your medicine and dosage times. What side effects may I notice from receiving this medicine? Side effects that you should report to your doctor or health care professional as soon as possible: -allergic reactions like skin rash, itching or hives, swelling of the face, lips, or tongue -signs and symptoms of bleeding such as bloody or black, tarry stools; red or dark-brown urine; spitting up blood or brown material that looks like coffee grounds; red spots on the skin; unusual bruising or bleeding from the eye, gums, or nose -signs and symptoms of a blood clot such as chest pain; shortness of breath; pain, swelling, or warmth in the leg -signs and symptoms of a stroke such as changes in vision; confusion; trouble speaking or understanding; severe headaches; sudden numbness or weakness of the face, arm or leg; trouble walking; dizziness; loss of coordination This list may not describe all possible side effects. Call your doctor for medical advice about side effects. You may report side effects to FDA at 1-800-FDA-1088. Where should I keep my medicine? Keep out of the reach of children. Store at room temperature between 20 and 25 degrees C (68 and 77 degrees F). Throw away any unused medicine after the expiration date. NOTE: This sheet is a summary. It may not cover all possible information. If you have questions about this medicine, talk to your doctor, pharmacist, or health care provider.  2019 Elsevier/Gold Standard (2016-12-25 11:20:07)

## 2018-05-01 DIAGNOSIS — L03116 Cellulitis of left lower limb: Secondary | ICD-10-CM | POA: Diagnosis not present

## 2018-05-01 DIAGNOSIS — N289 Disorder of kidney and ureter, unspecified: Secondary | ICD-10-CM | POA: Diagnosis not present

## 2018-05-01 DIAGNOSIS — R600 Localized edema: Secondary | ICD-10-CM | POA: Diagnosis not present

## 2018-05-01 DIAGNOSIS — N184 Chronic kidney disease, stage 4 (severe): Secondary | ICD-10-CM | POA: Diagnosis not present

## 2018-05-01 DIAGNOSIS — R945 Abnormal results of liver function studies: Secondary | ICD-10-CM | POA: Diagnosis not present

## 2018-05-01 DIAGNOSIS — M810 Age-related osteoporosis without current pathological fracture: Secondary | ICD-10-CM | POA: Diagnosis not present

## 2018-05-01 DIAGNOSIS — E1140 Type 2 diabetes mellitus with diabetic neuropathy, unspecified: Secondary | ICD-10-CM | POA: Diagnosis not present

## 2018-05-01 DIAGNOSIS — K219 Gastro-esophageal reflux disease without esophagitis: Secondary | ICD-10-CM | POA: Diagnosis not present

## 2018-05-03 DIAGNOSIS — I872 Venous insufficiency (chronic) (peripheral): Secondary | ICD-10-CM | POA: Diagnosis not present

## 2018-05-03 DIAGNOSIS — Z7982 Long term (current) use of aspirin: Secondary | ICD-10-CM | POA: Diagnosis not present

## 2018-05-03 DIAGNOSIS — L97829 Non-pressure chronic ulcer of other part of left lower leg with unspecified severity: Secondary | ICD-10-CM | POA: Diagnosis not present

## 2018-05-03 DIAGNOSIS — E119 Type 2 diabetes mellitus without complications: Secondary | ICD-10-CM | POA: Diagnosis not present

## 2018-05-03 DIAGNOSIS — I890 Lymphedema, not elsewhere classified: Secondary | ICD-10-CM | POA: Diagnosis not present

## 2018-05-03 DIAGNOSIS — L97819 Non-pressure chronic ulcer of other part of right lower leg with unspecified severity: Secondary | ICD-10-CM | POA: Diagnosis not present

## 2018-05-05 ENCOUNTER — Other Ambulatory Visit: Payer: Self-pay

## 2018-05-05 ENCOUNTER — Ambulatory Visit (INDEPENDENT_AMBULATORY_CARE_PROVIDER_SITE_OTHER): Payer: Medicare HMO

## 2018-05-05 DIAGNOSIS — Z7982 Long term (current) use of aspirin: Secondary | ICD-10-CM | POA: Diagnosis not present

## 2018-05-05 DIAGNOSIS — E119 Type 2 diabetes mellitus without complications: Secondary | ICD-10-CM | POA: Diagnosis not present

## 2018-05-05 DIAGNOSIS — L97829 Non-pressure chronic ulcer of other part of left lower leg with unspecified severity: Secondary | ICD-10-CM | POA: Diagnosis not present

## 2018-05-05 DIAGNOSIS — I1310 Hypertensive heart and chronic kidney disease without heart failure, with stage 1 through stage 4 chronic kidney disease, or unspecified chronic kidney disease: Secondary | ICD-10-CM

## 2018-05-05 DIAGNOSIS — L97819 Non-pressure chronic ulcer of other part of right lower leg with unspecified severity: Secondary | ICD-10-CM | POA: Diagnosis not present

## 2018-05-05 DIAGNOSIS — I4819 Other persistent atrial fibrillation: Secondary | ICD-10-CM

## 2018-05-05 DIAGNOSIS — I872 Venous insufficiency (chronic) (peripheral): Secondary | ICD-10-CM | POA: Diagnosis not present

## 2018-05-05 DIAGNOSIS — I890 Lymphedema, not elsewhere classified: Secondary | ICD-10-CM | POA: Diagnosis not present

## 2018-05-05 NOTE — Progress Notes (Signed)
Complete echocardiogram has been performed.  Jimmy Nainika Newlun, RDCS, RVT 

## 2018-05-07 DIAGNOSIS — E119 Type 2 diabetes mellitus without complications: Secondary | ICD-10-CM | POA: Diagnosis not present

## 2018-05-07 DIAGNOSIS — L97819 Non-pressure chronic ulcer of other part of right lower leg with unspecified severity: Secondary | ICD-10-CM | POA: Diagnosis not present

## 2018-05-07 DIAGNOSIS — Z7982 Long term (current) use of aspirin: Secondary | ICD-10-CM | POA: Diagnosis not present

## 2018-05-07 DIAGNOSIS — I872 Venous insufficiency (chronic) (peripheral): Secondary | ICD-10-CM | POA: Diagnosis not present

## 2018-05-07 DIAGNOSIS — I890 Lymphedema, not elsewhere classified: Secondary | ICD-10-CM | POA: Diagnosis not present

## 2018-05-07 DIAGNOSIS — L97829 Non-pressure chronic ulcer of other part of left lower leg with unspecified severity: Secondary | ICD-10-CM | POA: Diagnosis not present

## 2018-05-08 DIAGNOSIS — R600 Localized edema: Secondary | ICD-10-CM | POA: Diagnosis not present

## 2018-05-08 DIAGNOSIS — M810 Age-related osteoporosis without current pathological fracture: Secondary | ICD-10-CM | POA: Diagnosis not present

## 2018-05-08 DIAGNOSIS — N184 Chronic kidney disease, stage 4 (severe): Secondary | ICD-10-CM | POA: Diagnosis not present

## 2018-05-08 DIAGNOSIS — F419 Anxiety disorder, unspecified: Secondary | ICD-10-CM | POA: Diagnosis not present

## 2018-05-08 DIAGNOSIS — E1140 Type 2 diabetes mellitus with diabetic neuropathy, unspecified: Secondary | ICD-10-CM | POA: Diagnosis not present

## 2018-05-08 DIAGNOSIS — K219 Gastro-esophageal reflux disease without esophagitis: Secondary | ICD-10-CM | POA: Diagnosis not present

## 2018-05-08 DIAGNOSIS — R945 Abnormal results of liver function studies: Secondary | ICD-10-CM | POA: Diagnosis not present

## 2018-05-08 DIAGNOSIS — L03116 Cellulitis of left lower limb: Secondary | ICD-10-CM | POA: Diagnosis not present

## 2018-05-08 DIAGNOSIS — N289 Disorder of kidney and ureter, unspecified: Secondary | ICD-10-CM | POA: Diagnosis not present

## 2018-05-10 DIAGNOSIS — I890 Lymphedema, not elsewhere classified: Secondary | ICD-10-CM | POA: Diagnosis not present

## 2018-05-10 DIAGNOSIS — E119 Type 2 diabetes mellitus without complications: Secondary | ICD-10-CM | POA: Diagnosis not present

## 2018-05-10 DIAGNOSIS — L97819 Non-pressure chronic ulcer of other part of right lower leg with unspecified severity: Secondary | ICD-10-CM | POA: Diagnosis not present

## 2018-05-10 DIAGNOSIS — I872 Venous insufficiency (chronic) (peripheral): Secondary | ICD-10-CM | POA: Diagnosis not present

## 2018-05-10 DIAGNOSIS — L97829 Non-pressure chronic ulcer of other part of left lower leg with unspecified severity: Secondary | ICD-10-CM | POA: Diagnosis not present

## 2018-05-10 DIAGNOSIS — Z7982 Long term (current) use of aspirin: Secondary | ICD-10-CM | POA: Diagnosis not present

## 2018-05-12 DIAGNOSIS — Z7982 Long term (current) use of aspirin: Secondary | ICD-10-CM | POA: Diagnosis not present

## 2018-05-12 DIAGNOSIS — I890 Lymphedema, not elsewhere classified: Secondary | ICD-10-CM | POA: Diagnosis not present

## 2018-05-12 DIAGNOSIS — I872 Venous insufficiency (chronic) (peripheral): Secondary | ICD-10-CM | POA: Diagnosis not present

## 2018-05-12 DIAGNOSIS — L97829 Non-pressure chronic ulcer of other part of left lower leg with unspecified severity: Secondary | ICD-10-CM | POA: Diagnosis not present

## 2018-05-12 DIAGNOSIS — L97819 Non-pressure chronic ulcer of other part of right lower leg with unspecified severity: Secondary | ICD-10-CM | POA: Diagnosis not present

## 2018-05-12 DIAGNOSIS — E119 Type 2 diabetes mellitus without complications: Secondary | ICD-10-CM | POA: Diagnosis not present

## 2018-05-13 ENCOUNTER — Telehealth: Payer: Self-pay

## 2018-05-13 DIAGNOSIS — I4819 Other persistent atrial fibrillation: Secondary | ICD-10-CM | POA: Diagnosis not present

## 2018-05-13 NOTE — Telephone Encounter (Signed)
Attempted to reach patient to discuss results, but there was no answer and no voicemail. Will continue efforts.

## 2018-05-13 NOTE — Telephone Encounter (Signed)
Patient notified patient that the [facility] report has been reviewed by Dr Bettina Gavia and he states these are good results. Her heart rate is well controlled, and she should continue current medications. So far her testing echocardiogram and monitor are reassuring. Patient agreed to plan and verbalized understanding.

## 2018-05-14 DIAGNOSIS — M25551 Pain in right hip: Secondary | ICD-10-CM | POA: Diagnosis not present

## 2018-05-14 DIAGNOSIS — R945 Abnormal results of liver function studies: Secondary | ICD-10-CM | POA: Diagnosis not present

## 2018-05-14 DIAGNOSIS — E1140 Type 2 diabetes mellitus with diabetic neuropathy, unspecified: Secondary | ICD-10-CM | POA: Diagnosis not present

## 2018-05-14 DIAGNOSIS — N289 Disorder of kidney and ureter, unspecified: Secondary | ICD-10-CM | POA: Diagnosis not present

## 2018-05-14 DIAGNOSIS — M810 Age-related osteoporosis without current pathological fracture: Secondary | ICD-10-CM | POA: Diagnosis not present

## 2018-05-14 DIAGNOSIS — K219 Gastro-esophageal reflux disease without esophagitis: Secondary | ICD-10-CM | POA: Diagnosis not present

## 2018-05-14 DIAGNOSIS — R600 Localized edema: Secondary | ICD-10-CM | POA: Diagnosis not present

## 2018-05-14 DIAGNOSIS — F419 Anxiety disorder, unspecified: Secondary | ICD-10-CM | POA: Diagnosis not present

## 2018-05-14 DIAGNOSIS — N184 Chronic kidney disease, stage 4 (severe): Secondary | ICD-10-CM | POA: Diagnosis not present

## 2018-05-17 DIAGNOSIS — I890 Lymphedema, not elsewhere classified: Secondary | ICD-10-CM | POA: Diagnosis not present

## 2018-05-17 DIAGNOSIS — I872 Venous insufficiency (chronic) (peripheral): Secondary | ICD-10-CM | POA: Diagnosis not present

## 2018-05-17 DIAGNOSIS — L97829 Non-pressure chronic ulcer of other part of left lower leg with unspecified severity: Secondary | ICD-10-CM | POA: Diagnosis not present

## 2018-05-17 DIAGNOSIS — Z7982 Long term (current) use of aspirin: Secondary | ICD-10-CM | POA: Diagnosis not present

## 2018-05-17 DIAGNOSIS — L97819 Non-pressure chronic ulcer of other part of right lower leg with unspecified severity: Secondary | ICD-10-CM | POA: Diagnosis not present

## 2018-05-17 DIAGNOSIS — E119 Type 2 diabetes mellitus without complications: Secondary | ICD-10-CM | POA: Diagnosis not present

## 2018-05-21 DIAGNOSIS — L97819 Non-pressure chronic ulcer of other part of right lower leg with unspecified severity: Secondary | ICD-10-CM | POA: Diagnosis not present

## 2018-05-21 DIAGNOSIS — E119 Type 2 diabetes mellitus without complications: Secondary | ICD-10-CM | POA: Diagnosis not present

## 2018-05-21 DIAGNOSIS — I890 Lymphedema, not elsewhere classified: Secondary | ICD-10-CM | POA: Diagnosis not present

## 2018-05-21 DIAGNOSIS — L97829 Non-pressure chronic ulcer of other part of left lower leg with unspecified severity: Secondary | ICD-10-CM | POA: Diagnosis not present

## 2018-05-21 DIAGNOSIS — Z7982 Long term (current) use of aspirin: Secondary | ICD-10-CM | POA: Diagnosis not present

## 2018-05-21 DIAGNOSIS — I872 Venous insufficiency (chronic) (peripheral): Secondary | ICD-10-CM | POA: Diagnosis not present

## 2018-05-22 DIAGNOSIS — E1140 Type 2 diabetes mellitus with diabetic neuropathy, unspecified: Secondary | ICD-10-CM | POA: Diagnosis not present

## 2018-05-22 DIAGNOSIS — N184 Chronic kidney disease, stage 4 (severe): Secondary | ICD-10-CM | POA: Diagnosis not present

## 2018-05-22 DIAGNOSIS — L03115 Cellulitis of right lower limb: Secondary | ICD-10-CM | POA: Diagnosis not present

## 2018-05-22 DIAGNOSIS — K219 Gastro-esophageal reflux disease without esophagitis: Secondary | ICD-10-CM | POA: Diagnosis not present

## 2018-05-22 DIAGNOSIS — F419 Anxiety disorder, unspecified: Secondary | ICD-10-CM | POA: Diagnosis not present

## 2018-05-22 DIAGNOSIS — R600 Localized edema: Secondary | ICD-10-CM | POA: Diagnosis not present

## 2018-05-22 DIAGNOSIS — R945 Abnormal results of liver function studies: Secondary | ICD-10-CM | POA: Diagnosis not present

## 2018-05-22 DIAGNOSIS — M810 Age-related osteoporosis without current pathological fracture: Secondary | ICD-10-CM | POA: Diagnosis not present

## 2018-05-22 DIAGNOSIS — N289 Disorder of kidney and ureter, unspecified: Secondary | ICD-10-CM | POA: Diagnosis not present

## 2018-05-24 DIAGNOSIS — L97819 Non-pressure chronic ulcer of other part of right lower leg with unspecified severity: Secondary | ICD-10-CM | POA: Diagnosis not present

## 2018-05-24 DIAGNOSIS — I890 Lymphedema, not elsewhere classified: Secondary | ICD-10-CM | POA: Diagnosis not present

## 2018-05-24 DIAGNOSIS — L97829 Non-pressure chronic ulcer of other part of left lower leg with unspecified severity: Secondary | ICD-10-CM | POA: Diagnosis not present

## 2018-05-24 DIAGNOSIS — Z7982 Long term (current) use of aspirin: Secondary | ICD-10-CM | POA: Diagnosis not present

## 2018-05-24 DIAGNOSIS — I872 Venous insufficiency (chronic) (peripheral): Secondary | ICD-10-CM | POA: Diagnosis not present

## 2018-05-24 DIAGNOSIS — E119 Type 2 diabetes mellitus without complications: Secondary | ICD-10-CM | POA: Diagnosis not present

## 2018-05-25 DIAGNOSIS — I890 Lymphedema, not elsewhere classified: Secondary | ICD-10-CM | POA: Diagnosis not present

## 2018-05-25 DIAGNOSIS — I872 Venous insufficiency (chronic) (peripheral): Secondary | ICD-10-CM | POA: Diagnosis not present

## 2018-05-25 DIAGNOSIS — E119 Type 2 diabetes mellitus without complications: Secondary | ICD-10-CM | POA: Diagnosis not present

## 2018-05-25 DIAGNOSIS — L97819 Non-pressure chronic ulcer of other part of right lower leg with unspecified severity: Secondary | ICD-10-CM | POA: Diagnosis not present

## 2018-05-25 DIAGNOSIS — Z7982 Long term (current) use of aspirin: Secondary | ICD-10-CM | POA: Diagnosis not present

## 2018-05-25 DIAGNOSIS — L97829 Non-pressure chronic ulcer of other part of left lower leg with unspecified severity: Secondary | ICD-10-CM | POA: Diagnosis not present

## 2018-05-28 DIAGNOSIS — I890 Lymphedema, not elsewhere classified: Secondary | ICD-10-CM | POA: Diagnosis not present

## 2018-05-28 DIAGNOSIS — I872 Venous insufficiency (chronic) (peripheral): Secondary | ICD-10-CM | POA: Diagnosis not present

## 2018-05-28 DIAGNOSIS — E119 Type 2 diabetes mellitus without complications: Secondary | ICD-10-CM | POA: Diagnosis not present

## 2018-05-28 DIAGNOSIS — L97819 Non-pressure chronic ulcer of other part of right lower leg with unspecified severity: Secondary | ICD-10-CM | POA: Diagnosis not present

## 2018-05-28 DIAGNOSIS — Z7982 Long term (current) use of aspirin: Secondary | ICD-10-CM | POA: Diagnosis not present

## 2018-05-28 DIAGNOSIS — L97829 Non-pressure chronic ulcer of other part of left lower leg with unspecified severity: Secondary | ICD-10-CM | POA: Diagnosis not present

## 2018-06-01 DIAGNOSIS — N289 Disorder of kidney and ureter, unspecified: Secondary | ICD-10-CM | POA: Diagnosis not present

## 2018-06-01 DIAGNOSIS — F419 Anxiety disorder, unspecified: Secondary | ICD-10-CM | POA: Diagnosis not present

## 2018-06-01 DIAGNOSIS — M810 Age-related osteoporosis without current pathological fracture: Secondary | ICD-10-CM | POA: Diagnosis not present

## 2018-06-01 DIAGNOSIS — R945 Abnormal results of liver function studies: Secondary | ICD-10-CM | POA: Diagnosis not present

## 2018-06-01 DIAGNOSIS — E1140 Type 2 diabetes mellitus with diabetic neuropathy, unspecified: Secondary | ICD-10-CM | POA: Diagnosis not present

## 2018-06-01 DIAGNOSIS — R600 Localized edema: Secondary | ICD-10-CM | POA: Diagnosis not present

## 2018-06-01 DIAGNOSIS — M109 Gout, unspecified: Secondary | ICD-10-CM | POA: Diagnosis not present

## 2018-06-01 DIAGNOSIS — K219 Gastro-esophageal reflux disease without esophagitis: Secondary | ICD-10-CM | POA: Diagnosis not present

## 2018-06-01 DIAGNOSIS — N184 Chronic kidney disease, stage 4 (severe): Secondary | ICD-10-CM | POA: Diagnosis not present

## 2018-06-03 DIAGNOSIS — Z7982 Long term (current) use of aspirin: Secondary | ICD-10-CM | POA: Diagnosis not present

## 2018-06-03 DIAGNOSIS — L97829 Non-pressure chronic ulcer of other part of left lower leg with unspecified severity: Secondary | ICD-10-CM | POA: Diagnosis not present

## 2018-06-03 DIAGNOSIS — L97819 Non-pressure chronic ulcer of other part of right lower leg with unspecified severity: Secondary | ICD-10-CM | POA: Diagnosis not present

## 2018-06-03 DIAGNOSIS — E119 Type 2 diabetes mellitus without complications: Secondary | ICD-10-CM | POA: Diagnosis not present

## 2018-06-03 DIAGNOSIS — I890 Lymphedema, not elsewhere classified: Secondary | ICD-10-CM | POA: Diagnosis not present

## 2018-06-03 DIAGNOSIS — I872 Venous insufficiency (chronic) (peripheral): Secondary | ICD-10-CM | POA: Diagnosis not present

## 2018-06-08 DIAGNOSIS — E119 Type 2 diabetes mellitus without complications: Secondary | ICD-10-CM | POA: Diagnosis not present

## 2018-06-08 DIAGNOSIS — L97829 Non-pressure chronic ulcer of other part of left lower leg with unspecified severity: Secondary | ICD-10-CM | POA: Diagnosis not present

## 2018-06-08 DIAGNOSIS — I872 Venous insufficiency (chronic) (peripheral): Secondary | ICD-10-CM | POA: Diagnosis not present

## 2018-06-08 DIAGNOSIS — Z7982 Long term (current) use of aspirin: Secondary | ICD-10-CM | POA: Diagnosis not present

## 2018-06-08 DIAGNOSIS — L97819 Non-pressure chronic ulcer of other part of right lower leg with unspecified severity: Secondary | ICD-10-CM | POA: Diagnosis not present

## 2018-06-08 DIAGNOSIS — I890 Lymphedema, not elsewhere classified: Secondary | ICD-10-CM | POA: Diagnosis not present

## 2018-06-09 NOTE — Progress Notes (Signed)
Virtual Visit via Telephone Note   This visit type was conducted due to national recommendations for restrictions regarding the COVID-19 Pandemic (e.g. social distancing) in an effort to limit this patient's exposure and mitigate transmission in our community.  Due to her co-morbid illnesses, this patient is at least at moderate risk for complications without adequate follow up.  This format is felt to be most appropriate for this patient at this time.  The patient did not have access to video technology/had technical difficulties with video requiring transitioning to audio format only (telephone).  All issues noted in this document were discussed and addressed.  No physical exam could be performed with this format.  Please refer to the patient's chart for her  consent to telehealth for [facility].   Date:  06/09/2018   ID:  Carla Robinson, DOB 07-29-1934, MRN [id number]  Patient Location: Home Provider Location: Office  PCP:  Cher Nakai, MD  Cardiologist:  Shirlee More, MD  Electrophysiologist:  None   Evaluation Performed:  Follow-Up Visit  Chief Complaint:  84 yo female PMH atrial fibrillation presents for 4 week follow up after echo and Zio monitor with complaint of hypotension.   History of Present Illness:     Carla Robinson is a 84 y.o. female with  atrial fibrillation CHADS2 vasc score =4 moderate stroke risk.  She was found to have atrial fibrillation during a routine office visit 04/21/2018.   The patient does not have symptoms concerning for COVID-19 infection (fever, chills, cough, or new shortness of breath).  Echocardiogram was performed 05/05/2018 showed normal left ventricular systolic function, normal right ventricular size and function mild to moderate mitral regurgitation mild left atrial enlargement and the peak pulmonary artery systolic pressure was mildly elevated by Doppler echo 44 mmHg.  2-day ZIO monitor at the same time showed prominent atrial fibrillation with  minimum average and maximum heart rates of 60, 80 to 120 bpm with well-controlled heart rate for atrial fibrillation  She reports not feeling well the last few days. Has had a GI illness associated with diarrhea. Her BP this morning was 84/30 this morning and she is unable to recheck during our visit as she is in the restroom. She has an appointment with her PCP Saturday and encouraged her to bring up this issue. In the interim will discontinue Losartan and HCTZ. She states her swelling has gone down and her legs have mostly healed up. She is compliant with her anticoagulation on reduced dose Eliquis for age and impaired kidney function - she denies bleeding complications. She denies palpitations and chest pain and is unaware of her atrial fibrillation.   Past Medical History:  Diagnosis Date   Anxiety    Arthritis    Cancer ([place])    Chronic kidney disease    Depression    Diabetes mellitus without complication (HCC)    GERD (gastroesophageal reflux disease)    Hyperlipidemia    Hypertension    Ulcer    Past Surgical History:  Procedure Laterality Date   CHOLECYSTECTOMY     MOHS SURGERY     REPLACEMENT TOTAL KNEE Right      No outpatient medications have been marked as taking for the 06/10/18 encounter (Appointment) with Richardo Priest, MD.     Allergies:   Codeine; Doxycycline; and Sulfa antibiotics   Social History   Tobacco Use   Smoking status: Never Smoker   Smokeless tobacco: Never Used  Substance Use Topics   Alcohol  use: No   Drug use: No     Family Hx: The patient's family history includes CAD in her father and mother; Hypertension in her brother, father, mother, and sister.  ROS:   Please see the history of present illness.     All other systems reviewed and are negative.   Prior CV studies:   The following studies were reviewed today:  Echo 05/05/18: 1. The left ventricular thickness and wall motion is normal and visually estimated EF is  approx 60-65%. Diastolic function could not be assessed.  2. The right ventricle has normal systolic function. The cavity was normal. There is no increase in right ventricular wall thickness.  3. Left atrial size was moderately dilated.  4. The mitral valve is grossly normal. Mitral valve regurgitation is mild to moderate by color flow Doppler.  5. The aortic valve is grossly normal. Aortic valve regurgitation was not assessed by color flow Doppler.   Zio Monitor 05/05/18: A ZIO monitor was performed for 2 days 12 hours beginning 05/05/2018 to assess heart rate control with atrial fibrillation. The rhythm throughout is atrial fibrillation with minimum average and maximum heart rates of 60, 82 and 120 bpm.  There were no pauses of 3 seconds or greater and no sustained bradycardia. There were no symptomatic or triggered events. Ventricular ectopy is rare less than 1% with isolated PVCs  Conclusion, persistent atrial fibrillation with good heart rate control.   Labs/Other Tests and Data Reviewed:    EKG:  No ECG reviewed.  Recent Labs: No results found for requested labs within last 8760 hours.   Recent Lipid Panel No results found for: CHOL, TRIG, HDL, CHOLHDL, LDLCALC, LDLDIRECT  Wt Readings from Last 3 Encounters:  04/29/18 210 lb (95.3 kg)     Objective:    Vital Signs:  There were no vitals taken for this visit.   VITAL SIGNS:  reviewed  ASSESSMENT & PLAN:    1. Hypertensive heart and chronic kidney disease - Hypotensive today likely due to GI illness, will adjust antihypertensive medications. She denies anginal symptoms. Reports swelling has gone way down and her legs have "mostly healed up". Does not weigh daily, encouraged her to do so. Ecoh 4/22 with normal EF. Continue current diuretic.   Encourage low salt, heart healthy diet.   Encourage regular activity - states she has been using a wheelchair for the last 3 weeks.  2. Persistent atrial fibrillation - She is unaware of  her atrial fibrillation and reports no palpitations or SOB. Rate controlled on 05/05/18 Zio with average HR of 83 and maximum HR 120. Continue Metoprolol 25 BID. As she is asymptomatic and rate controlled would not add antiarrythmic will consider cardioversion at this time 3. Chronic anticoagulation - Secondary to atrial fibrillation. On reduced dose Eliquis due to age and kidney function. Denies bleeding complications. On PPI.  4. Hypotension - Reports 3 day GI illness associated with diarrhea. Expect this is the cause of her hypotension 84/30 today as she reports recent BP 110s/70-80s. Will discontinue Losartan and HCTZ to prevent recurrent hypotension. Concern for orthostatic hypotension and risk of subsequent fall while on anticoagulant. 5. HLD - Monitored by PCP. Continue statin. Encourage low fat, heart healthy diet.  6. Hypotension associated with diarrhea no evidence of bleeding we will go ahead and stop her ACE inhibitor thiazide diuretic encourage her to contact her PCP regarding enteritis.  COVID-19 Education: The signs and symptoms of COVID-19 were discussed with the patient and how to  seek care for testing (follow up with PCP or arrange E-visit).  The importance of social distancing was discussed today.  Time:   Today, I have spent 25 minutes with the patient with telehealth technology discussing the above problems.     Medication Adjustments/Labs and Tests Ordered: Current medicines are reviewed at length with the patient today.  Concerns regarding medicines are outlined above.   Tests Ordered: No orders of the defined types were placed in this encounter.   Medication Changes: No orders of the defined types were placed in this encounter.   Disposition:  Follow up in 6 month(s)  Signed, Shirlee More, MD  06/09/2018 2:42 PM    [facility]

## 2018-06-10 ENCOUNTER — Encounter: Payer: Self-pay | Admitting: Cardiology

## 2018-06-10 ENCOUNTER — Telehealth (INDEPENDENT_AMBULATORY_CARE_PROVIDER_SITE_OTHER): Payer: Medicare HMO | Admitting: Cardiology

## 2018-06-10 DIAGNOSIS — I4819 Other persistent atrial fibrillation: Secondary | ICD-10-CM

## 2018-06-10 DIAGNOSIS — E782 Mixed hyperlipidemia: Secondary | ICD-10-CM

## 2018-06-10 DIAGNOSIS — Z7901 Long term (current) use of anticoagulants: Secondary | ICD-10-CM

## 2018-06-10 DIAGNOSIS — I959 Hypotension, unspecified: Secondary | ICD-10-CM | POA: Insufficient documentation

## 2018-06-10 DIAGNOSIS — E861 Hypovolemia: Secondary | ICD-10-CM

## 2018-06-10 DIAGNOSIS — I1310 Hypertensive heart and chronic kidney disease without heart failure, with stage 1 through stage 4 chronic kidney disease, or unspecified chronic kidney disease: Secondary | ICD-10-CM

## 2018-06-10 NOTE — Patient Instructions (Addendum)
Medication Instructions:  Your physician has recommended you make the following change in your medication:   STOP losartan STOP hydrochlorothiazide   If you need a refill on your cardiac medications before your next appointment, please call your pharmacy.   Lab work: None  If you have labs (blood work) drawn today and your tests are completely normal, you will receive your results only by: Marland Kitchen MyChart Message (if you have MyChart) OR . A paper copy in the mail If you have any lab test that is abnormal or we need to change your treatment, we will call you to review the results.  Testing/Procedures: None  Follow-Up: At [facility], you and your health needs are our priority.  As part of our continuing mission to provide you with exceptional heart care, we have created designated Provider Care Teams.  These Care Teams include your primary Cardiologist (physician) and Advanced Practice Providers (APPs -  Physician Assistants and Nurse Practitioners) who all work together to provide you with the care you need, when you need it. You will need a follow up appointment in 6 months: Monday, 12/13/2018, at 3:00 pm in the [place] office.

## 2018-06-11 DIAGNOSIS — L97829 Non-pressure chronic ulcer of other part of left lower leg with unspecified severity: Secondary | ICD-10-CM | POA: Diagnosis not present

## 2018-06-11 DIAGNOSIS — L97819 Non-pressure chronic ulcer of other part of right lower leg with unspecified severity: Secondary | ICD-10-CM | POA: Diagnosis not present

## 2018-06-11 DIAGNOSIS — I872 Venous insufficiency (chronic) (peripheral): Secondary | ICD-10-CM | POA: Diagnosis not present

## 2018-06-11 DIAGNOSIS — I890 Lymphedema, not elsewhere classified: Secondary | ICD-10-CM | POA: Diagnosis not present

## 2018-06-11 DIAGNOSIS — Z7982 Long term (current) use of aspirin: Secondary | ICD-10-CM | POA: Diagnosis not present

## 2018-06-11 DIAGNOSIS — E119 Type 2 diabetes mellitus without complications: Secondary | ICD-10-CM | POA: Diagnosis not present

## 2018-06-14 DIAGNOSIS — M810 Age-related osteoporosis without current pathological fracture: Secondary | ICD-10-CM | POA: Diagnosis not present

## 2018-06-15 DIAGNOSIS — L97829 Non-pressure chronic ulcer of other part of left lower leg with unspecified severity: Secondary | ICD-10-CM | POA: Diagnosis not present

## 2018-06-15 DIAGNOSIS — Z7982 Long term (current) use of aspirin: Secondary | ICD-10-CM | POA: Diagnosis not present

## 2018-06-15 DIAGNOSIS — I890 Lymphedema, not elsewhere classified: Secondary | ICD-10-CM | POA: Diagnosis not present

## 2018-06-15 DIAGNOSIS — I872 Venous insufficiency (chronic) (peripheral): Secondary | ICD-10-CM | POA: Diagnosis not present

## 2018-06-15 DIAGNOSIS — L97819 Non-pressure chronic ulcer of other part of right lower leg with unspecified severity: Secondary | ICD-10-CM | POA: Diagnosis not present

## 2018-06-15 DIAGNOSIS — E119 Type 2 diabetes mellitus without complications: Secondary | ICD-10-CM | POA: Diagnosis not present

## 2018-06-18 DIAGNOSIS — L97829 Non-pressure chronic ulcer of other part of left lower leg with unspecified severity: Secondary | ICD-10-CM | POA: Diagnosis not present

## 2018-06-18 DIAGNOSIS — I872 Venous insufficiency (chronic) (peripheral): Secondary | ICD-10-CM | POA: Diagnosis not present

## 2018-06-18 DIAGNOSIS — Z7982 Long term (current) use of aspirin: Secondary | ICD-10-CM | POA: Diagnosis not present

## 2018-06-18 DIAGNOSIS — I890 Lymphedema, not elsewhere classified: Secondary | ICD-10-CM | POA: Diagnosis not present

## 2018-06-18 DIAGNOSIS — L97819 Non-pressure chronic ulcer of other part of right lower leg with unspecified severity: Secondary | ICD-10-CM | POA: Diagnosis not present

## 2018-06-18 DIAGNOSIS — E119 Type 2 diabetes mellitus without complications: Secondary | ICD-10-CM | POA: Diagnosis not present

## 2018-06-19 DIAGNOSIS — K219 Gastro-esophageal reflux disease without esophagitis: Secondary | ICD-10-CM | POA: Diagnosis not present

## 2018-06-19 DIAGNOSIS — E1140 Type 2 diabetes mellitus with diabetic neuropathy, unspecified: Secondary | ICD-10-CM | POA: Diagnosis not present

## 2018-06-19 DIAGNOSIS — L309 Dermatitis, unspecified: Secondary | ICD-10-CM | POA: Diagnosis not present

## 2018-06-19 DIAGNOSIS — N184 Chronic kidney disease, stage 4 (severe): Secondary | ICD-10-CM | POA: Diagnosis not present

## 2018-06-19 DIAGNOSIS — R945 Abnormal results of liver function studies: Secondary | ICD-10-CM | POA: Diagnosis not present

## 2018-06-19 DIAGNOSIS — I4891 Unspecified atrial fibrillation: Secondary | ICD-10-CM | POA: Diagnosis not present

## 2018-06-19 DIAGNOSIS — R600 Localized edema: Secondary | ICD-10-CM | POA: Diagnosis not present

## 2018-06-19 DIAGNOSIS — N289 Disorder of kidney and ureter, unspecified: Secondary | ICD-10-CM | POA: Diagnosis not present

## 2018-06-22 ENCOUNTER — Telehealth: Payer: Self-pay | Admitting: Cardiology

## 2018-06-22 MED ORDER — ASPIRIN EC 81 MG PO TBEC: 81.0000 mg | DELAYED_RELEASE_TABLET | Freq: Every day | ORAL | 3 refills | Status: DC

## 2018-06-22 NOTE — Telephone Encounter (Signed)
I think the best thing we can do is have her stop her Eliquis and I really think she needs to see her primary care physician as I am not sure that is the cause of her problems and she needs to have a more broad-based evaluation and calling a cardiology office.

## 2018-06-22 NOTE — Telephone Encounter (Signed)
Yes aspirin 81 mg a day

## 2018-06-22 NOTE — Addendum Note (Signed)
Addended by: Polly Cobia A on: 06/22/2018 04:53 PM   Modules accepted: Orders

## 2018-06-22 NOTE — Telephone Encounter (Signed)
Phoned patient and directed to stop eliquis and see her PCP regarding her rash. She verbalizes understanding of this and would like to know if she should restart her Aspirin.   pls advise, tx

## 2018-06-22 NOTE — Telephone Encounter (Signed)
Would the eliquis be replaced by something else?

## 2018-06-22 NOTE — Telephone Encounter (Signed)
Not at this time.

## 2018-06-22 NOTE — Telephone Encounter (Signed)
Called patient and instructed to start taking 81 mg aspirin daily. She verbalizes understanding, no other questions/concerns.

## 2018-06-22 NOTE — Telephone Encounter (Signed)
Patient states that she's been on Eliquis for about a month and developed a rash and itching  To her back, sides, abdomen, face and head within 2 days as well and nausea. She hasn't been able to eat due to this and feels very weak. States she has told her PCP about above symptoms when she's been in to see him for routine bloodwork and was told she had to be on the medication and that her body would adjust to it.     Describes the rash as tiny red bumps under the skin. Home Health RN is seeing her for lower extremity wound care. Per pt, the RN felt the rash could be from too much dead skin, helped her bathe with special soap but it didn't help. She denies chest pressure/pain or shortness of breath. Her weight was #132 but she's lost weight due to decreasing edema in her legs and from not eating much.     pls advise, tx

## 2018-06-22 NOTE — Telephone Encounter (Signed)
Patient is having reaction from Eliquis and having itching and feeling awful from it.. patient states she cant take.Carla Robinson Please call her.Carla Robinson

## 2018-06-26 DIAGNOSIS — K219 Gastro-esophageal reflux disease without esophagitis: Secondary | ICD-10-CM | POA: Diagnosis not present

## 2018-06-26 DIAGNOSIS — N289 Disorder of kidney and ureter, unspecified: Secondary | ICD-10-CM | POA: Diagnosis not present

## 2018-06-26 DIAGNOSIS — E1140 Type 2 diabetes mellitus with diabetic neuropathy, unspecified: Secondary | ICD-10-CM | POA: Diagnosis not present

## 2018-06-26 DIAGNOSIS — R945 Abnormal results of liver function studies: Secondary | ICD-10-CM | POA: Diagnosis not present

## 2018-06-26 DIAGNOSIS — M109 Gout, unspecified: Secondary | ICD-10-CM | POA: Diagnosis not present

## 2018-06-26 DIAGNOSIS — M810 Age-related osteoporosis without current pathological fracture: Secondary | ICD-10-CM | POA: Diagnosis not present

## 2018-06-26 DIAGNOSIS — N184 Chronic kidney disease, stage 4 (severe): Secondary | ICD-10-CM | POA: Diagnosis not present

## 2018-06-26 DIAGNOSIS — F419 Anxiety disorder, unspecified: Secondary | ICD-10-CM | POA: Diagnosis not present

## 2018-06-26 DIAGNOSIS — R600 Localized edema: Secondary | ICD-10-CM | POA: Diagnosis not present

## 2018-07-02 DIAGNOSIS — R600 Localized edema: Secondary | ICD-10-CM | POA: Diagnosis not present

## 2018-07-02 DIAGNOSIS — K219 Gastro-esophageal reflux disease without esophagitis: Secondary | ICD-10-CM | POA: Diagnosis not present

## 2018-07-02 DIAGNOSIS — R945 Abnormal results of liver function studies: Secondary | ICD-10-CM | POA: Diagnosis not present

## 2018-07-02 DIAGNOSIS — E1140 Type 2 diabetes mellitus with diabetic neuropathy, unspecified: Secondary | ICD-10-CM | POA: Diagnosis not present

## 2018-07-02 DIAGNOSIS — N184 Chronic kidney disease, stage 4 (severe): Secondary | ICD-10-CM | POA: Diagnosis not present

## 2018-07-02 DIAGNOSIS — M109 Gout, unspecified: Secondary | ICD-10-CM | POA: Diagnosis not present

## 2018-07-02 DIAGNOSIS — N185 Chronic kidney disease, stage 5: Secondary | ICD-10-CM | POA: Diagnosis not present

## 2018-07-02 DIAGNOSIS — M810 Age-related osteoporosis without current pathological fracture: Secondary | ICD-10-CM | POA: Diagnosis not present

## 2018-07-02 DIAGNOSIS — R42 Dizziness and giddiness: Secondary | ICD-10-CM | POA: Diagnosis not present

## 2018-07-07 DIAGNOSIS — R58 Hemorrhage, not elsewhere classified: Secondary | ICD-10-CM | POA: Diagnosis not present

## 2018-07-07 DIAGNOSIS — R0902 Hypoxemia: Secondary | ICD-10-CM | POA: Diagnosis not present

## 2018-07-07 DIAGNOSIS — E871 Hypo-osmolality and hyponatremia: Secondary | ICD-10-CM | POA: Diagnosis not present

## 2018-07-07 DIAGNOSIS — S299XXA Unspecified injury of thorax, initial encounter: Secondary | ICD-10-CM | POA: Diagnosis not present

## 2018-07-07 DIAGNOSIS — R627 Adult failure to thrive: Secondary | ICD-10-CM | POA: Diagnosis not present

## 2018-07-07 DIAGNOSIS — S0990XA Unspecified injury of head, initial encounter: Secondary | ICD-10-CM | POA: Diagnosis not present

## 2018-07-07 DIAGNOSIS — I110 Hypertensive heart disease with heart failure: Secondary | ICD-10-CM | POA: Diagnosis not present

## 2018-07-07 DIAGNOSIS — E7800 Pure hypercholesterolemia, unspecified: Secondary | ICD-10-CM | POA: Diagnosis not present

## 2018-07-07 DIAGNOSIS — R197 Diarrhea, unspecified: Secondary | ICD-10-CM | POA: Diagnosis not present

## 2018-07-07 DIAGNOSIS — R609 Edema, unspecified: Secondary | ICD-10-CM | POA: Diagnosis not present

## 2018-07-07 DIAGNOSIS — R531 Weakness: Secondary | ICD-10-CM | POA: Diagnosis not present

## 2018-07-07 DIAGNOSIS — N179 Acute kidney failure, unspecified: Secondary | ICD-10-CM | POA: Diagnosis not present

## 2018-07-07 DIAGNOSIS — I509 Heart failure, unspecified: Secondary | ICD-10-CM | POA: Diagnosis not present

## 2018-07-07 DIAGNOSIS — Z03818 Encounter for observation for suspected exposure to other biological agents ruled out: Secondary | ICD-10-CM | POA: Diagnosis not present

## 2018-07-08 ENCOUNTER — Inpatient Hospital Stay (HOSPITAL_COMMUNITY)
Admission: AD | Admit: 2018-07-08 | Discharge: 2018-07-11 | DRG: 683 | Disposition: A | Discharge status: Home Health | Payer: Medicare HMO | Source: Other Acute Inpatient Hospital | Attending: Internal Medicine | Admitting: Internal Medicine

## 2018-07-08 DIAGNOSIS — R630 Anorexia: Secondary | ICD-10-CM | POA: Diagnosis present

## 2018-07-08 DIAGNOSIS — Z881 Allergy status to other antibiotic agents status: Secondary | ICD-10-CM

## 2018-07-08 DIAGNOSIS — D631 Anemia in chronic kidney disease: Secondary | ICD-10-CM | POA: Diagnosis present

## 2018-07-08 DIAGNOSIS — Z9181 History of falling: Secondary | ICD-10-CM

## 2018-07-08 DIAGNOSIS — I959 Hypotension, unspecified: Secondary | ICD-10-CM | POA: Diagnosis not present

## 2018-07-08 DIAGNOSIS — E871 Hypo-osmolality and hyponatremia: Secondary | ICD-10-CM | POA: Diagnosis present

## 2018-07-08 DIAGNOSIS — F329 Major depressive disorder, single episode, unspecified: Secondary | ICD-10-CM | POA: Diagnosis present

## 2018-07-08 DIAGNOSIS — N390 Urinary tract infection, site not specified: Secondary | ICD-10-CM | POA: Diagnosis not present

## 2018-07-08 DIAGNOSIS — E785 Hyperlipidemia, unspecified: Secondary | ICD-10-CM | POA: Diagnosis present

## 2018-07-08 DIAGNOSIS — I5032 Chronic diastolic (congestive) heart failure: Secondary | ICD-10-CM | POA: Diagnosis present

## 2018-07-08 DIAGNOSIS — I509 Heart failure, unspecified: Secondary | ICD-10-CM | POA: Diagnosis not present

## 2018-07-08 DIAGNOSIS — F419 Anxiety disorder, unspecified: Secondary | ICD-10-CM | POA: Diagnosis present

## 2018-07-08 DIAGNOSIS — N179 Acute kidney failure, unspecified: Principal | ICD-10-CM | POA: Diagnosis present

## 2018-07-08 DIAGNOSIS — E7800 Pure hypercholesterolemia, unspecified: Secondary | ICD-10-CM | POA: Diagnosis not present

## 2018-07-08 DIAGNOSIS — L89151 Pressure ulcer of sacral region, stage 1: Secondary | ICD-10-CM | POA: Diagnosis present

## 2018-07-08 DIAGNOSIS — K219 Gastro-esophageal reflux disease without esophagitis: Secondary | ICD-10-CM | POA: Diagnosis present

## 2018-07-08 DIAGNOSIS — W1830XA Fall on same level, unspecified, initial encounter: Secondary | ICD-10-CM | POA: Diagnosis present

## 2018-07-08 DIAGNOSIS — M1990 Unspecified osteoarthritis, unspecified site: Secondary | ICD-10-CM | POA: Diagnosis present

## 2018-07-08 DIAGNOSIS — I132 Hypertensive heart and chronic kidney disease with heart failure and with stage 5 chronic kidney disease, or end stage renal disease: Secondary | ICD-10-CM | POA: Diagnosis not present

## 2018-07-08 DIAGNOSIS — Z885 Allergy status to narcotic agent status: Secondary | ICD-10-CM

## 2018-07-08 DIAGNOSIS — R627 Adult failure to thrive: Secondary | ICD-10-CM | POA: Diagnosis not present

## 2018-07-08 DIAGNOSIS — E440 Moderate protein-calorie malnutrition: Secondary | ICD-10-CM | POA: Diagnosis not present

## 2018-07-08 DIAGNOSIS — I878 Other specified disorders of veins: Secondary | ICD-10-CM | POA: Diagnosis present

## 2018-07-08 DIAGNOSIS — I230 Hemopericardium as current complication following acute myocardial infarction: Secondary | ICD-10-CM | POA: Diagnosis not present

## 2018-07-08 DIAGNOSIS — Z96651 Presence of right artificial knee joint: Secondary | ICD-10-CM | POA: Diagnosis present

## 2018-07-08 DIAGNOSIS — E869 Volume depletion, unspecified: Secondary | ICD-10-CM | POA: Diagnosis present

## 2018-07-08 DIAGNOSIS — M2550 Pain in unspecified joint: Secondary | ICD-10-CM | POA: Diagnosis not present

## 2018-07-08 DIAGNOSIS — I4819 Other persistent atrial fibrillation: Secondary | ICD-10-CM | POA: Diagnosis not present

## 2018-07-08 DIAGNOSIS — I110 Hypertensive heart disease with heart failure: Secondary | ICD-10-CM | POA: Diagnosis not present

## 2018-07-08 DIAGNOSIS — L8990 Pressure ulcer of unspecified site, unspecified stage: Secondary | ICD-10-CM | POA: Insufficient documentation

## 2018-07-08 DIAGNOSIS — S41111A Laceration without foreign body of right upper arm, initial encounter: Secondary | ICD-10-CM | POA: Diagnosis present

## 2018-07-08 DIAGNOSIS — Z7952 Long term (current) use of systemic steroids: Secondary | ICD-10-CM

## 2018-07-08 DIAGNOSIS — Z791 Long term (current) use of non-steroidal anti-inflammatories (NSAID): Secondary | ICD-10-CM

## 2018-07-08 DIAGNOSIS — E1122 Type 2 diabetes mellitus with diabetic chronic kidney disease: Secondary | ICD-10-CM | POA: Diagnosis present

## 2018-07-08 DIAGNOSIS — Z7401 Bed confinement status: Secondary | ICD-10-CM | POA: Diagnosis not present

## 2018-07-08 DIAGNOSIS — N185 Chronic kidney disease, stage 5: Secondary | ICD-10-CM | POA: Diagnosis not present

## 2018-07-08 DIAGNOSIS — Z7982 Long term (current) use of aspirin: Secondary | ICD-10-CM

## 2018-07-08 DIAGNOSIS — N189 Chronic kidney disease, unspecified: Secondary | ICD-10-CM | POA: Diagnosis present

## 2018-07-08 DIAGNOSIS — L89320 Pressure ulcer of left buttock, unstageable: Secondary | ICD-10-CM | POA: Diagnosis present

## 2018-07-08 DIAGNOSIS — N184 Chronic kidney disease, stage 4 (severe): Secondary | ICD-10-CM | POA: Diagnosis present

## 2018-07-08 DIAGNOSIS — Z6833 Body mass index (BMI) 33.0-33.9, adult: Secondary | ICD-10-CM

## 2018-07-08 DIAGNOSIS — I1310 Hypertensive heart and chronic kidney disease without heart failure, with stage 1 through stage 4 chronic kidney disease, or unspecified chronic kidney disease: Secondary | ICD-10-CM | POA: Diagnosis present

## 2018-07-08 DIAGNOSIS — Z8249 Family history of ischemic heart disease and other diseases of the circulatory system: Secondary | ICD-10-CM | POA: Diagnosis not present

## 2018-07-08 DIAGNOSIS — Z79899 Other long term (current) drug therapy: Secondary | ICD-10-CM

## 2018-07-08 DIAGNOSIS — I482 Chronic atrial fibrillation, unspecified: Secondary | ICD-10-CM | POA: Diagnosis not present

## 2018-07-08 DIAGNOSIS — I9589 Other hypotension: Secondary | ICD-10-CM | POA: Diagnosis not present

## 2018-07-08 DIAGNOSIS — Z882 Allergy status to sulfonamides status: Secondary | ICD-10-CM

## 2018-07-08 DIAGNOSIS — L89310 Pressure ulcer of right buttock, unstageable: Secondary | ICD-10-CM | POA: Diagnosis present

## 2018-07-08 DIAGNOSIS — E861 Hypovolemia: Secondary | ICD-10-CM | POA: Diagnosis not present

## 2018-07-08 LAB — CBC WITH DIFFERENTIAL/PLATELET
Abs Immature Granulocytes: 0.07 10*3/uL (ref 0.00–0.07)
Basophils Absolute: 0 10*3/uL (ref 0.0–0.1)
Basophils Relative: 0 %
Eosinophils Absolute: 0.3 10*3/uL (ref 0.0–0.5)
Eosinophils Relative: 4 %
HCT: 29.8 % — ABNORMAL LOW (ref 36.0–46.0)
Hemoglobin: 9.5 g/dL — ABNORMAL LOW (ref 12.0–15.0)
Immature Granulocytes: 1 %
Lymphocytes Relative: 8 %
Lymphs Abs: 0.6 10*3/uL — ABNORMAL LOW (ref 0.7–4.0)
MCH: 27.3 pg (ref 26.0–34.0)
MCHC: 31.9 g/dL (ref 30.0–36.0)
MCV: 85.6 fL (ref 80.0–100.0)
Monocytes Absolute: 1.2 10*3/uL — ABNORMAL HIGH (ref 0.1–1.0)
Monocytes Relative: 16 %
Neutro Abs: 5.3 10*3/uL (ref 1.7–7.7)
Neutrophils Relative %: 71 %
Platelets: 170 10*3/uL (ref 150–400)
RBC: 3.48 MIL/uL — ABNORMAL LOW (ref 3.87–5.11)
RDW: 16.7 % — ABNORMAL HIGH (ref 11.5–15.5)
WBC: 7.6 10*3/uL (ref 4.0–10.5)
nRBC: 0 % (ref 0.0–0.2)

## 2018-07-08 LAB — URINALYSIS, COMPLETE (UACMP) WITH MICROSCOPIC
Bilirubin Urine: NEGATIVE
Glucose, UA: NEGATIVE mg/dL
Ketones, ur: NEGATIVE mg/dL
Nitrite: NEGATIVE
Protein, ur: NEGATIVE mg/dL
Specific Gravity, Urine: 1.013 (ref 1.005–1.030)
pH: 5 (ref 5.0–8.0)

## 2018-07-08 LAB — COMPREHENSIVE METABOLIC PANEL
ALT: 16 U/L (ref 0–44)
AST: 21 U/L (ref 15–41)
Albumin: 2.2 g/dL — ABNORMAL LOW (ref 3.5–5.0)
Alkaline Phosphatase: 93 U/L (ref 38–126)
Anion gap: 14 (ref 5–15)
BUN: 123 mg/dL — ABNORMAL HIGH (ref 8–23)
CO2: 17 mmol/L — ABNORMAL LOW (ref 22–32)
Calcium: 8.8 mg/dL — ABNORMAL LOW (ref 8.9–10.3)
Chloride: 98 mmol/L (ref 98–111)
Creatinine, Ser: 4.41 mg/dL — ABNORMAL HIGH (ref 0.44–1.00)
GFR calc Af Amer: 10 mL/min — ABNORMAL LOW (ref >60)
GFR calc non Af Amer: 9 mL/min — ABNORMAL LOW (ref >60)
Glucose, Bld: 142 mg/dL — ABNORMAL HIGH (ref 70–99)
Potassium: 4.2 mmol/L (ref 3.5–5.1)
Sodium: 129 mmol/L — ABNORMAL LOW (ref 135–145)
Total Bilirubin: 1.7 mg/dL — ABNORMAL HIGH (ref 0.3–1.2)
Total Protein: 5.6 g/dL — ABNORMAL LOW (ref 6.5–8.1)

## 2018-07-08 LAB — GLUCOSE, CAPILLARY
Glucose-Capillary: 91 mg/dL (ref 70–99)
Glucose-Capillary: 115 mg/dL — ABNORMAL HIGH (ref 70–99)

## 2018-07-08 LAB — HEMOGLOBIN A1C
Hgb A1c MFr Bld: 5.9 % — ABNORMAL HIGH (ref 4.8–5.6)
Mean Plasma Glucose: 122.63 mg/dL

## 2018-07-08 LAB — BRAIN NATRIURETIC PEPTIDE: B Natriuretic Peptide: 828.8 pg/mL — ABNORMAL HIGH (ref 0.0–100.0)

## 2018-07-08 LAB — TSH: TSH: 1.565 u[IU]/mL (ref 0.350–4.500)

## 2018-07-08 LAB — SODIUM, URINE, RANDOM: Sodium, Ur: <10 mmol/L

## 2018-07-08 LAB — CREATININE, URINE, RANDOM: Creatinine, Urine: 70.8 mg/dL

## 2018-07-08 LAB — MAGNESIUM: Magnesium: 1.3 mg/dL — ABNORMAL LOW (ref 1.7–2.4)

## 2018-07-08 MED ORDER — SODIUM CHLORIDE 0.9 % IV SOLN
INTRAVENOUS | Status: DC
  Administered 2018-07-08 – 2018-07-10 (×5): via INTRAVENOUS

## 2018-07-08 MED ORDER — ACETAMINOPHEN 325 MG PO TABS: 325.0000 mg | ORAL_TABLET | Freq: Four times a day (QID) | ORAL | Status: DC | PRN

## 2018-07-08 MED ORDER — METOPROLOL TARTRATE 12.5 MG HALF TABLET
12.5000 mg | ORAL_TABLET | Freq: Two times a day (BID) | ORAL | Status: DC
  Administered 2018-07-08 – 2018-07-11 (×6): 12.5 mg via ORAL
  Filled 2018-07-08 (×6): qty 1

## 2018-07-08 MED ORDER — PANTOPRAZOLE SODIUM 40 MG PO TBEC
40.0000 mg | DELAYED_RELEASE_TABLET | Freq: Every day | ORAL | Status: DC
  Administered 2018-07-08 – 2018-07-11 (×4): 40 mg via ORAL
  Filled 2018-07-08 (×4): qty 1

## 2018-07-08 MED ORDER — CARBOXYMETHYLCELLUL-GLYCERIN 1-0.25 % OP SOLN: Freq: Three times a day (TID) | OPHTHALMIC | Status: DC | PRN

## 2018-07-08 MED ORDER — FESOTERODINE FUMARATE ER 4 MG PO TB24: 4.0000 mg | ORAL_TABLET | Freq: Every day | ORAL | Status: DC

## 2018-07-08 MED ORDER — HEPARIN SODIUM (PORCINE) 5000 UNIT/ML IJ SOLN
5000.0000 [IU] | Freq: Three times a day (TID) | INTRAMUSCULAR | Status: DC
  Administered 2018-07-08 – 2018-07-11 (×9): 5000 [IU] via SUBCUTANEOUS
  Filled 2018-07-08 (×8): qty 1

## 2018-07-08 MED ORDER — ALPRAZOLAM 0.5 MG PO TABS
0.5000 mg | ORAL_TABLET | Freq: Two times a day (BID) | ORAL | Status: DC
  Administered 2018-07-08 – 2018-07-11 (×6): 0.5 mg via ORAL
  Filled 2018-07-08 (×6): qty 1

## 2018-07-08 MED ORDER — INSULIN ASPART 100 UNIT/ML ~~LOC~~ SOLN
0.0000 [IU] | Freq: Three times a day (TID) | SUBCUTANEOUS | Status: DC
  Administered 2018-07-10 (×2): 1 [IU] via SUBCUTANEOUS

## 2018-07-08 MED ORDER — SALICYLIC ACID 3 % EX CREA: 1.0000 "application " | TOPICAL_CREAM | CUTANEOUS | Status: DC | PRN

## 2018-07-08 MED ORDER — ALBUTEROL SULFATE (2.5 MG/3ML) 0.083% IN NEBU: 3.0000 mL | INHALATION_SOLUTION | RESPIRATORY_TRACT | Status: DC | PRN

## 2018-07-08 MED ORDER — POLYVINYL ALCOHOL 1.4 % OP SOLN
1.0000 [drp] | OPHTHALMIC | Status: DC | PRN
  Filled 2018-07-08: qty 15

## 2018-07-08 MED ORDER — ACETAMINOPHEN 650 MG RE SUPP: 650.0000 mg | Freq: Four times a day (QID) | RECTAL | Status: DC | PRN

## 2018-07-08 MED ORDER — MUPIROCIN 2 % EX OINT
1.0000 "application " | TOPICAL_OINTMENT | Freq: Two times a day (BID) | CUTANEOUS | Status: DC
  Administered 2018-07-08 – 2018-07-10 (×3): 1 via TOPICAL
  Filled 2018-07-08 (×3): qty 22

## 2018-07-08 MED ORDER — ATORVASTATIN CALCIUM 40 MG PO TABS
40.0000 mg | ORAL_TABLET | Freq: Every day | ORAL | Status: DC
  Administered 2018-07-08 – 2018-07-10 (×3): 40 mg via ORAL
  Filled 2018-07-08 (×3): qty 1

## 2018-07-08 MED ORDER — ACETAMINOPHEN 325 MG PO TABS: 650.0000 mg | ORAL_TABLET | Freq: Four times a day (QID) | ORAL | Status: DC | PRN

## 2018-07-08 NOTE — Progress Notes (Signed)
Spoke with pt's son Louie Casa. I paged MD to call son with updates. Also updated contact information. Pt's son also requested that staff call Wells Guiles if he cannot be reached.

## 2018-07-08 NOTE — H&P (Addendum)
History and Physical:    Carla Robinson   YHC:[id number] DOB: 10/29/1934 DOA: 07/08/2018  Referring MD/provider: [facility] PCP: Cher Nakai, MD   Patient coming from: Home  Chief Complaint: Fall and FTT x 3 weeks.  History as per patient and documentation from [facility]  History of Present Illness:   Carla Robinson is an 84 y.o. female with past medical history significant for hypertension, diabetes, chronic kidney disease was diagnosed with atrial fibrillation in April of this year who now presents with weakness for the past couple of weeks and a fall earlier today.  Patient states that she was doing well until she was placed on Eliquis for her atrial fibrillation.  She states that since she has been taking her Eliquis she has had fatigue, anorexia, a rash and generally not feeling well.  Patient states she stopped taking her Eliquis 3 weeks ago however since that time she has had persistent nausea and anorexia, so states she has had significantly decreased p.o. intake since then.  Patient was brought into [facility] yesterday when she had a mechanical fall.  Patient denies any chest pain or dizziness or palpitations before the fall.  However she does say that she is just been very tired and has not been very active.  She notes that prior to 3 weeks ago she was able to take care of her adult daughter who has cognitive delay, do her own grocery shopping and cooking.  Over the past 2 weeks her sister-in-law has been bringing her food because she has been so tired and fatigued.  At [facility] patient was noted to be in significantly worsened kidney function with a creatinine of 5.5 up from 3.5.  Head CT at that time was negative for any acute intracranial process.  She was transferred from [facility] due to significant worsening of her renal failure.  He of systems is notable for a rash on her anterior forearms that she has been scratching while she has been  on Eliquis leading to ecchymoses on forearms.  She notes rash is improved since she has been off the Eliquis.  Denies any fevers or chills.  Denies any shortness of breath.  No chest pain no DOE no palpitations.  She does note frequent ecchymoses on legs and forearms since starting the Eliquis where she does not remember actually hitting herself.  No abdominal pain.  No nausea vomiting or diarrhea.  ROS:   ROS   Review of Systems: General: No fever, chills, weight changes Skin: Admits to rash in anterior forearm since she started the Eliquis improving off of the Eliquis. Endocrine: no heat/cold intolerance, no polyuria Respiratory: No cough,, shortness of breath, hemoptysis Cardiovascular: No palpitations, chest pain GI: No nausea, vomiting, diarrhea, constipation CNS: No numbness, dizziness, headache   Past Medical History:   Past Medical History:  Diagnosis Date   Anxiety    Arthritis    Cancer ([place])    Chronic kidney disease    Depression    Diabetes mellitus without complication (HCC)    GERD (gastroesophageal reflux disease)    Hyperlipidemia    Hypertension    Ulcer     Past Surgical History:   Past Surgical History:  Procedure Laterality Date   CHOLECYSTECTOMY     MOHS SURGERY     REPLACEMENT TOTAL KNEE Right     Social History:   Social History   Socioeconomic History   Marital status: Widowed    Spouse name:  Not on file   Number of children: Not on file   Years of education: Not on file   Highest education level: Not on file  Occupational History   Not on file  Social Needs   Financial resource strain: Not on file   Food insecurity    Worry: Not on file    Inability: Not on file   Transportation needs    Medical: Not on file    Non-medical: Not on file  Tobacco Use   Smoking status: Never Smoker   Smokeless tobacco: Never Used  Substance and Sexual Activity   Alcohol use: No   Drug use: No   Sexual activity: Not on  file  Lifestyle   Physical activity    Days per week: Not on file    Minutes per session: Not on file   Stress: Not on file  Relationships   Social connections    Talks on phone: Not on file    Gets together: Not on file    Attends religious service: Not on file    Active member of club or organization: Not on file    Attends meetings of clubs or organizations: Not on file    Relationship status: Not on file   Intimate partner violence    Fear of current or ex partner: Not on file    Emotionally abused: Not on file    Physically abused: Not on file    Forced sexual activity: Not on file  Other Topics Concern   Not on file  Social History Narrative   Not on file    Allergies   Codeine, Doxycycline, and Sulfa antibiotics  Family history:   Family History  Problem Relation Age of Onset   Hypertension Mother    CAD Mother    Hypertension Father    CAD Father    Hypertension Sister    Hypertension Brother     Current Medications:   Prior to Admission medications   Medication Sig Start Date End Date Taking? Authorizing Provider  albuterol (PROVENTIL HFA;VENTOLIN HFA) 108 (90 Base) MCG/ACT inhaler Inhale 2 puffs into the lungs every 4 (four) hours as needed. 03/15/18   [provider]  allopurinol (ZYLOPRIM) 100 MG tablet Take 100 mg by mouth daily.    [provider]  ALPRAZolam Duanne Moron) 0.5 MG tablet Take 0.5 mg by mouth at bedtime as needed for anxiety.    [provider]  aspirin EC 81 MG tablet Take 1 tablet (81 mg total) by mouth daily. 06/22/18   Richardo Priest, MD  atorvastatin (LIPITOR) 40 MG tablet Take 40 mg by mouth daily. 03/11/18   [provider]  colchicine 0.6 MG tablet Take 0.6 mg by mouth daily.    [provider]  furosemide (LASIX) 40 MG tablet Take 40 mg by mouth daily.  04/20/18   [provider]  glucose blood test strip 1 each by Other route as needed for other. Use as instructed     [provider]  metoprolol tartrate (LOPRESSOR) 25 MG tablet Take 25 mg by mouth 2 (two) times daily.    [provider]  mupirocin ointment (BACTROBAN) 2 % Apply 1 application topically 2 (two) times daily. 03/11/18   [provider]  nabumetone (RELAFEN) 750 MG tablet Take 750 mg by mouth daily. 02/03/15   [provider]  omeprazole (PRILOSEC) 20 MG capsule Take 20 mg by mouth daily.  02/21/15   [provider]  potassium  chloride SA (K-DUR,KLOR-CON) 20 MEQ tablet Take 20 mEq by mouth daily.     [provider]  predniSONE (DELTASONE) 20 MG tablet Take 20 mg by mouth daily. 04/13/18   [provider]  silver sulfADIAZINE (SILVADENE) 1 % cream Apply 1 application topically daily. 01/18/18   Stover, Titorya, DPM  TOVIAZ 4 MG TB24 tablet Take 1 tablet by mouth daily. 04/02/18   [provider]    Physical Exam:   Vitals:   07/08/18 1406 07/08/18 1657  BP: (!) 91/47 (!) 111/53  Pulse: 88 83  Resp: 18   Temp: (!) 97.5 F (36.4 C)   TempSrc: Axillary   SpO2: 100%   Weight: 84.1 kg   Height: _0  (1.575 m)      Physical Exam: Blood pressure (!) 111/53, pulse 83, temperature (!) 97.5 F (36.4 C), temperature source Axillary, resp. rate 18, height _1  (1.575 m), weight 84.1 kg, SpO2 100 %.   Vitals from [place]: Temperature 98.6: 96: 107/59, 99% pulse ox, respirations 20 Gen: Chronically ill-appearing, listless female lying flat in bed in no acute respiratory distress Eyes: Sclerae anicteric. Conjunctiva mildly injected. Chest: Moderately good air entry bilaterally with no adventitious sounds.  CV: Distant, irregular, no audible murmurs. Abdomen: NABS, soft, nondistended, nontender. No tenderness to light or deep palpation. No rebound, no guarding. Extremities: Patient has brawny 1-2+ edema in her lower extremities. Skin: She has scattered ecchymoses on her arms and legs.  No ecchymoses on her back or her abdomen.     Neuro: Alert and oriented times 3 ever she clearly has slowness of thought and speech.   Data Review:    Labs:  From [facility]:  CBC: White count 8.0 hemoglobin 10.9 hematocrit 33.4 platelets 203 MCV 86 neutrophils 95% lymphocytes 10% BMP: Sodium 125 potassium 5.3 chloride 92 carbon dioxide 14 anion gap 24 BUN 135 creatinine 5.2 estimated GFR 8 glucose 122 calcium 9.1 total bili 1.8 AST 28 ALT 18 alk phos 109 LDH 677 slightly elevated. Troponin 0 0.06 C-reactive protein 35.7 BNP 11,500 total protein 6.1 albumin 3.3 d-dimer 4226 INR 1.1 lactic acid 1.1 procalcitonin less than 0.05  Radiographic Studies:  From [facility]:  Chest x-ray: No active disease.  Mild cardiomegaly  Head CT without contrast: Impression:  1 no acute intracranial abnormality.   2 there is an old right temporal lobe infarct.  This is new since October 31, 2015.    3 there is volume loss and chronic microvascular ischemic changes.   EKG: Independently reviewed.  [facility]: Atrial fibrillation at 92.  Q in 2, 3 and F.  Poor R wave progression.  T wave inversions 3 and F.   Assessment/Plan:   Principal Problem:   Acute on chronic renal failure (HCC) Active Problems:   Hypertensive heart and chronic kidney disease without heart failure, with stage 1 through stage 4 chronic kidney disease, or unspecified chronic kidney disease   CKD (chronic kidney disease) stage 4, GFR 15-29 ml/min (HCC)   Persistent atrial fibrillation   Hypotension   84 year old female with chronic kidney disease secondary to hypertension and diabetes who has had decreased p.o. intake for the past 3 weeks now presents with a fall and worsening of her chronic kidney disease.  ACUTE ON CHRONIC KIDNEY FAILURE I suspect patient's worsening of kidney function is likely secondary to intravascular volume depletion from decreased p.o. intake as well as ongoing NSAID use.   Will stop nabumetone.   Will check urine  sodium and creatinine to calculate a FEna. In the meanwhile will hydrate patient gently and follow.  HYPONATREMIA Should improve with hydration.  Recheck in the morning.  ATRIAL FIBRILLATION Patient is rate controlled at present I will decrease her metoprolol from 25-12.5 twice daily to maintain some rate control. She attributes her poor health to the Eliquis that she had been placed on and is not sure she wants to restart any anticoagulation.  I have placed patient on prophylactic doses of heparin, not full treatment doses.  This can be readdressed with the patient tomorrow as warranted.  At present patient clearly is thinking quite slowly and should be feeling improved with some hydration.  FALL Patient status post mechanical fall yesterday.  Head CT done at [facility] is negative for any acute intracranial bleed.  She has been off of her Eliquis for the past 3 weeks per her report.  He does have multiple ecchymoses on her arms and legs but no ecchymoses on her abdomen or back.  HTN Patient is presently hypotensive but has improved with fluid resuscitation I am continuing her metoprolol however holding her Lasix.  DM Fingersticks AC at bedtime with sliding scale coverage.  HFpEF No evidence of any acute decompensation of heart failure.  GERD To new pantoprazole   Other information:   DVT prophylaxis: Lovenox ordered. Code Status: Full code. Family Communication: Spoke with patient's son Kaelani Kendrick at [phone]. Disposition Plan: Home or TBD Consults called: Curbside renal, they said to reconsult if not improved with hydration. Admission status: Inpatient  The medical decision making on this patient was of high complexity and the patient is at high risk for clinical deterioration, therefore this is a level 3 visit.   Dewaine Oats Tublu Carla Robinson [facility]  If 7PM-7AM, please contact night-coverage [email or web address] Password [facility] 07/08/2018, 5:37 PM

## 2018-07-08 NOTE — Progress Notes (Signed)
Pt admitted to unit via PTAR. Pt AOX4 and understands to call when assistance is needed. Pt assessment complete and placed on tele. Call bell within reach. Will continue to monitor.

## 2018-07-08 NOTE — Progress Notes (Signed)
Pt recent labs available in epic for review.  NP on call notified.

## 2018-07-09 DIAGNOSIS — I9589 Other hypotension: Secondary | ICD-10-CM

## 2018-07-09 DIAGNOSIS — N179 Acute kidney failure, unspecified: Principal | ICD-10-CM

## 2018-07-09 DIAGNOSIS — N184 Chronic kidney disease, stage 4 (severe): Secondary | ICD-10-CM

## 2018-07-09 DIAGNOSIS — E440 Moderate protein-calorie malnutrition: Secondary | ICD-10-CM | POA: Insufficient documentation

## 2018-07-09 DIAGNOSIS — E861 Hypovolemia: Secondary | ICD-10-CM

## 2018-07-09 DIAGNOSIS — I4819 Other persistent atrial fibrillation: Secondary | ICD-10-CM

## 2018-07-09 DIAGNOSIS — I1310 Hypertensive heart and chronic kidney disease without heart failure, with stage 1 through stage 4 chronic kidney disease, or unspecified chronic kidney disease: Secondary | ICD-10-CM

## 2018-07-09 LAB — BASIC METABOLIC PANEL
Anion gap: 12 (ref 5–15)
BUN: 115 mg/dL — ABNORMAL HIGH (ref 8–23)
CO2: 17 mmol/L — ABNORMAL LOW (ref 22–32)
Calcium: 8.6 mg/dL — ABNORMAL LOW (ref 8.9–10.3)
Chloride: 101 mmol/L (ref 98–111)
Creatinine, Ser: 3.71 mg/dL — ABNORMAL HIGH (ref 0.44–1.00)
GFR calc Af Amer: 12 mL/min — ABNORMAL LOW (ref >60)
GFR calc non Af Amer: 11 mL/min — ABNORMAL LOW (ref >60)
Glucose, Bld: 108 mg/dL — ABNORMAL HIGH (ref 70–99)
Potassium: 4.3 mmol/L (ref 3.5–5.1)
Sodium: 130 mmol/L — ABNORMAL LOW (ref 135–145)

## 2018-07-09 LAB — GLUCOSE, CAPILLARY
Glucose-Capillary: 87 mg/dL (ref 70–99)
Glucose-Capillary: 162 mg/dL — ABNORMAL HIGH (ref 70–99)
Glucose-Capillary: 164 mg/dL — ABNORMAL HIGH (ref 70–99)
Glucose-Capillary: 160 mg/dL — ABNORMAL HIGH (ref 70–99)

## 2018-07-09 MED ORDER — ADULT MULTIVITAMIN W/MINERALS CH
1.0000 | ORAL_TABLET | Freq: Every day | ORAL | Status: DC
  Administered 2018-07-09 – 2018-07-11 (×3): 1 via ORAL
  Filled 2018-07-09 (×3): qty 1

## 2018-07-09 MED ORDER — COLLAGENASE 250 UNIT/GM EX OINT
TOPICAL_OINTMENT | Freq: Every day | CUTANEOUS | Status: DC
  Administered 2018-07-09 – 2018-07-11 (×3): via TOPICAL
  Filled 2018-07-09: qty 30

## 2018-07-09 MED ORDER — SODIUM CHLORIDE 0.9% FLUSH
10.0000 mL | Freq: Two times a day (BID) | INTRAVENOUS | Status: DC
  Administered 2018-07-09 – 2018-07-11 (×4): 10 mL

## 2018-07-09 MED ORDER — SODIUM CHLORIDE 0.9% FLUSH: 10.0000 mL | INTRAVENOUS | Status: DC | PRN

## 2018-07-09 MED ORDER — SODIUM CHLORIDE 0.9 % IV SOLN
1.0000 g | INTRAVENOUS | Status: DC
  Administered 2018-07-09 – 2018-07-10 (×2): 1 g via INTRAVENOUS
  Filled 2018-07-09 (×2): qty 10

## 2018-07-09 MED ORDER — GERHARDT'S BUTT CREAM
TOPICAL_CREAM | Freq: Two times a day (BID) | CUTANEOUS | Status: DC
  Administered 2018-07-09 – 2018-07-11 (×5): via TOPICAL
  Filled 2018-07-09: qty 1

## 2018-07-09 MED ORDER — PRO-STAT SUGAR FREE PO LIQD
30.0000 mL | Freq: Three times a day (TID) | ORAL | Status: DC
  Administered 2018-07-09 – 2018-07-11 (×6): 30 mL via ORAL
  Filled 2018-07-09 (×6): qty 30

## 2018-07-09 MED ORDER — DIPHENHYDRAMINE HCL 25 MG PO CAPS
25.0000 mg | ORAL_CAPSULE | Freq: Four times a day (QID) | ORAL | Status: DC | PRN
  Administered 2018-07-09: 25 mg via ORAL
  Filled 2018-07-09: qty 1

## 2018-07-09 NOTE — Consult Note (Signed)
[facility] Nurse wound consult note Reason for Consult: multiple lacerations Patient with history of multiple falls and trauma to the arms and legs. Skin tears noted on the upper and lower extremities. Patient is also on a blood thinner. Patient self reports that sometimes she is wet from urine. She reports she stays in lift chair for long periods of time.  Wound type: 1. Unstageable pressure injury: left ischium; 100% yellow nonviable tissue 2. Unstageable pressure injury: right ischium; 100% yellow nonviable 3. Healed area that is scabbed over left heel 4. Stage 1 pressure injury in combination of moisture associated skin damage; sacrum: 100% non blanchable red tissue Multiple areas of trauma and some blistering noted on the right pretibial area.  Patient has evidence and history of venous stasis disease. Has had compression just a few months ago with Unna's boots and HHRN to change 2x week  Pressure Injury POA: Yes Measurement: Right ischium: 2.5cm x 1.5cm x 0.2cm  Left ischium: 1cmx 1.5cm x 0.2cm  Sacrum: 9cm x 6cm x 0cm  Wound bed:see above  Drainage (amount, consistency, odor) minimal from the ischial wounds, no odor Periwound: intact with evidence of previous rash over her body Dressing procedure/placement/frequency: 1. Add low air loss mattress for moisture and pressure redistribution 2. Add enzymatic debridement to the bilateral ischial wounds to clean up non viable tissue, change daily 3. Add chair pressure redistribution pad for patient when up in the chair/send home for use 4. Add Unna's boots bilaterally for compression therapy for venous stasis dx.; change 2x wk will need HHRN for continued therapy and assessment.  5. Add Gerhardt's butt cream for protection of the sacrum that is vunerable to moisture.  6. Add xeroform to skin tears of the right arm, change every other day.   Discussed POC with patient and bedside nurse.  Re consult if needed, will not follow at this time. Thanks  Kaily Wragg [employer], RN,CWOCN, CNS, [place] [phone])

## 2018-07-09 NOTE — Progress Notes (Signed)
Occupational Therapy Evaluation Patient Details Name: Carla Robinson MRN: [id number] DOB: 11-21-1934 Today's Date: 07/09/2018    History of Present Illness  84 y.o. female with past medical history significant for hypertension, diabetes, chronic kidney disease was diagnosed with atrial fibrillation in April of this year who now presents with weakness for the past couple of weeks and a fall    Clinical Impression   Pt admitted with above deficits. PTA, pt lives at home with cognitively impaired daughter but does have assist available from other family members. Pt reports she was independent with BADLs at home and used RW.  Pt currently presents with generalized weakness impacting her ability to perform ADLs and functional mobility. Min-max assist for UB/LB ADLs. +2 min assist for sit<>stand and min guard assist to complete stand pivot transfer with RW. Pt reports she can have 24/7 supervision/assist from family members at home.  Recommend HHOT for discharge plan. Will continue to follow acutely in order to maximize safety and independence with ADLs.    Follow Up Recommendations  Home health OT;Supervision/Assistance - 24 hour    Equipment Recommendations  None recommended by OT    Recommendations for Other Services       Precautions / Restrictions Precautions Precautions: Fall;Other (comment) Precaution Comments: multiple skin tears Restrictions Weight Bearing Restrictions: No      Mobility Bed Mobility Overal bed mobility: Needs Assistance Bed Mobility: Supine to Sit     Supine to sit: Mod assist     General bed mobility comments: Assist to elevate trunk and use of pad to scoot hips to EOB.  Transfers Overall transfer level: Needs assistance Equipment used: Rolling walker (2 wheeled) Transfers: Sit to/from [facility] Sit to Stand: +2 physical assistance;Min assist Stand pivot transfers: Min guard       General transfer comment: Assist to power up from  bed. Once in standing, able to pivot from bed to recliner with min guard with RW.    Balance                                           ADL either performed or assessed with clinical judgement   ADL Overall ADL's : Needs assistance/impaired Eating/Feeding: Independent;Sitting   Grooming: Supervision/safety;Sitting   Upper Body Bathing: Supervision/ safety;Sitting   Lower Body Bathing: Moderate assistance;Sitting/lateral leans   Upper Body Dressing : Minimal assistance;Sitting   Lower Body Dressing: Maximal assistance;Sitting/lateral leans   Toilet Transfer: +2 for physical assistance;Minimal assistance;Stand-pivot;RW Toilet Transfer Details (indicate cue type and reason): simulated Toileting- Clothing Manipulation and Hygiene: +2 for physical assistance;Minimal assistance;Sit to/from stand               Vision Baseline Vision/History: Wears glasses Wears Glasses: Reading only Patient Visual Report: No change from baseline       Perception     Praxis      Pertinent Vitals/Pain       Hand Dominance     Extremity/Trunk Assessment Upper Extremity Assessment Upper Extremity Assessment: Generalized weakness(multiple skin tears)   Lower Extremity Assessment Lower Extremity Assessment: Defer to PT evaluation       Communication Communication Communication: No difficulties   Cognition Arousal/Alertness: Awake/alert Behavior During Therapy: WFL for tasks assessed/performed Overall Cognitive Status: Within Functional Limits for tasks assessed  General Comments       Exercises     Shoulder Instructions      Home Living Family/patient expects to be discharged to:: Private residence Living Arrangements: Children Available Help at Discharge: Family;Available 24 hours/day Type of Home: House Home Access: Ramped entrance     Home Layout: Able to live on main level with  bedroom/bathroom(walks around house to lower level entrance for walk in showe)     Bathroom Shower/Tub: Occupational psychologist: Standard Bathroom Accessibility: Yes How Accessible: Accessible via walker Home Equipment: [place] - standard;Walker - 2 wheels;Toilet riser;Bedside commode;Shower seat(rollator)          Prior Functioning/Environment Level of Independence: Independent with assistive device(s)        Comments: Pt reports she is independent with ADLs with RW. She waits to shower until family members are in the house.  She lives with adult daughter who has cognitive impairments. However, she has two sons, daughter in law and grandchildren who can provide 24/7 assist.        OT Problem List: Decreased strength;Decreased activity tolerance;Impaired balance (sitting and/or standing);Decreased knowledge of use of DME or AE      OT Treatment/Interventions: Self-care/ADL training;Therapeutic exercise;DME and/or AE instruction;Therapeutic activities;Patient/family education;Balance training    OT Goals(Current goals can be found in the care plan section) Acute Rehab OT Goals Patient Stated Goal: to get stronger OT Goal Formulation: With patient Time For Goal Achievement: 07/23/18 Potential to Achieve Goals: Good  OT Frequency: Min 2X/week   Barriers to D/C:            Co-evaluation PT/OT/SLP Co-Evaluation/Treatment: Yes Reason for Co-Treatment: For patient/therapist safety;To address functional/ADL transfers   OT goals addressed during session: ADL's and self-care      AM-PAC OT "6 Clicks" Daily Activity     Outcome Measure Help from another person eating meals?: None Help from another person taking care of personal grooming?: None Help from another person toileting, which includes using toliet, bedpan, or urinal?: A Lot(+2 to stand) Help from another person bathing (including washing, rinsing, drying)?: A Lot Help from another person to put on and taking  off regular upper body clothing?: A Little Help from another person to put on and taking off regular lower body clothing?: A Lot 6 Click Score: 17   End of Session Equipment Utilized During Treatment: Rolling walker Nurse Communication: Mobility status  Activity Tolerance: Patient tolerated treatment well Patient left: in chair;with call bell/phone within reach;with chair alarm set;with nursing/sitter in room  OT Visit Diagnosis: Unsteadiness on feet (R26.81);History of falling (Z91.81)                Time: 2458-0998 OT Time Calculation (min): 22 min Charges:  OT General Charges $OT Visit: 1 Visit OT Evaluation $OT Eval Moderate Complexity: 1 Mod   Darrol Jump OTR/L [place] [phone] 07/09/2018, 4:21 PM

## 2018-07-09 NOTE — Progress Notes (Addendum)
Pt IV in right arm leaking slightly, left arm site sluggish to flush. Pt arms covered with small tears / abrasions. When a tourniquet was applied to assess for additional IV access, blood began seeping from multiple sites on the pt forearm. Tourniquet removed, on call hospitalist informed. Request made for IV team consult to evaluate if PICC may be appropriate for pt.

## 2018-07-09 NOTE — Consult Note (Signed)
[facility] Renal Consultation Note  Requesting MD: Mikhail  Indication for Consultation:  Advanced CKD  Chief complaint: "I fell"  HPI:  Carla Robinson is a 84 y.o. female with a history of reported HTN, DM, and CKD and atrial fibrillation who presented to [facility] from an outside ER.  She had gone to the ER after a fall and was found to have worsening renal failure with Cr 4.41 as below.  She was hydrated and a foley was placed and her creatinine has improved to 3.71 today.  History from the patient is somewhat limited.  She takes several minutes to answer any questions, initially is silent and then asking me "who are you?" several times.  She denies any NSAID's however relafen is listed below on historical meds.  She states that she has had DM and HTN for several years.   Creatinine, Ser  Date/Time Value Ref Range Status  07/09/2018 03:12 AM 3.71 (H) 0.44 - 1.00 mg/dL Final  07/08/2018 07:25 PM 4.41 (H) 0.44 - 1.00 mg/dL Final  05/23/2010 08:38 PM 1.40 (H) 0.4 - 1.2 mg/dL Final    PMHx:   Past Medical History:  Diagnosis Date  . Anxiety   . Arthritis   . Cancer ([place])   . Chronic kidney disease   . Depression   . Diabetes mellitus without complication ([place])   . GERD (gastroesophageal reflux disease)   . Hyperlipidemia   . Hypertension   . Ulcer     Past Surgical History:  Procedure Laterality Date  . CHOLECYSTECTOMY    . MOHS SURGERY    . REPLACEMENT TOTAL KNEE Right     Family Hx:  Family History  Problem Relation Age of Onset  . Hypertension Mother   . CAD Mother   . Hypertension Father   . CAD Father   . Hypertension Sister   . Hypertension Brother     Social History:  reports that she has never smoked. She has never used smokeless tobacco. She reports that she does not drink alcohol or use drugs.  Allergies:  Allergies  Allergen Reactions  . Tape Other (See Comments)    PATIENT'S SKIN IS PRONE TO TEARING AND BRUISING- CAN AN ALTERNATIVE  TO TAPE PLEASE BE USED????  . Codeine Rash  . Doxycycline Rash  . Eliquis [Apixaban] Itching and Rash  . Sulfa Antibiotics Rash    Medications: Prior to Admission medications   Medication Sig Start Date End Date Taking? Authorizing Provider  acetaminophen (TYLENOL) 325 MG tablet Take 325-650 mg by mouth every 6 (six) hours as needed for mild pain or headache.   Yes [provider]  albuterol (PROVENTIL HFA;VENTOLIN HFA) 108 (90 Base) MCG/ACT inhaler Inhale 2 puffs into the lungs every 4 (four) hours as needed for wheezing or shortness of breath.  03/15/18  Yes [provider]  allopurinol (ZYLOPRIM) 100 MG tablet Take 100 mg by mouth daily.   Yes [provider]  ALPRAZolam Duanne Moron) 0.5 MG tablet Take 0.5 mg by mouth 2 (two) times a day. MORNING and BEDTIME   Yes [provider]  aspirin EC 325 MG tablet Take 325 mg by mouth at bedtime.   Yes [provider]  atorvastatin (LIPITOR) 40 MG tablet Take 40 mg by mouth at bedtime.  03/11/18  Yes [provider]  Carboxymethylcellul-Glycerin (CLEAR EYES FOR DRY EYES OP) Place 1-2 drops into both eyes 3 (three) times daily as needed (for dryness).   Yes [provider]  meclizine (ANTIVERT) 25 MG tablet Take 25 mg by mouth every 6 (six) hours as needed for dizziness.   Yes [provider]  metoprolol tartrate (LOPRESSOR) 25 MG tablet Take 25 mg by mouth 2 (two) times daily.   Yes [provider]  mupirocin ointment (BACTROBAN) 2 % Apply 1 application topically See admin instructions. Apply to affected areas of legs 2 times a day as directed 03/11/18  Yes [provider]  nabumetone (RELAFEN) 750 MG tablet Take 750 mg by mouth 2 (two) times daily as needed for mild pain.  02/03/15  Yes [provider]  omeprazole (PRILOSEC) 20 MG capsule Take 20 mg by mouth 2 (two) times daily before a meal.  02/21/15  Yes [provider]  Salicylic Acid (GOLD BOND  PSORIASIS RELIEF) 3 % CREA Apply 1 application topically as needed (to all affected areas of the arms and legs).   Yes [provider]  aspirin EC 81 MG tablet Take 1 tablet (81 mg total) by mouth daily. Patient not taking: Reported on 07/08/2018 06/22/18   Richardo Priest, MD  furosemide (LASIX) 40 MG tablet Take 40 mg by mouth daily.  04/20/18   [provider]  glucose blood test strip 1 each by Other route as needed for other. Use as instructed    [provider]  silver sulfADIAZINE (SILVADENE) 1 % cream Apply 1 application topically daily. Patient not taking: Reported on 07/08/2018 01/18/18   Landis Martins, DPM  TOVIAZ 4 MG TB24 tablet Take 1 tablet by mouth daily. 04/02/18   [provider]    I have reviewed the patient's current reported medications.  Labs:  BMP Latest Ref Rng & Units 07/09/2018 07/08/2018 05/23/2010  Glucose 70 - 99 mg/dL 108(H) 142(H) 125(H)  BUN 8 - 23 mg/dL 115(H) 123(H) 16  Creatinine 0.44 - 1.00 mg/dL 3.71(H) 4.41(H) 1.40(H)  Sodium 135 - 145 mmol/L 130(L) 129(L) 136  Potassium 3.5 - 5.1 mmol/L 4.3 4.2 3.3(L)  Chloride 98 - 111 mmol/L 101 98 95(L)  CO2 22 - 32 mmol/L 17(L) 17(L) -  Calcium 8.9 - 10.3 mg/dL 8.6(L) 8.8(L) -    Urinalysis    Component Value Date/Time   COLORURINE YELLOW 07/08/2018 2320   APPEARANCEUR HAZY (A) 07/08/2018 2320   LABSPEC 1.013 07/08/2018 2320   PHURINE 5.0 07/08/2018 2320   GLUCOSEU NEGATIVE 07/08/2018 2320   HGBUR MODERATE (A) 07/08/2018 2320   BILIRUBINUR NEGATIVE 07/08/2018 2320   KETONESUR NEGATIVE 07/08/2018 2320   PROTEINUR NEGATIVE 07/08/2018 2320   NITRITE NEGATIVE 07/08/2018 2320   LEUKOCYTESUR LARGE (A) 07/08/2018 2320     ROS: Limited secondary to patient factors    Physical Exam: Vitals:   07/09/18 0607 07/09/18 1616  BP: (!) 92/54 (!) 91/58  Pulse: 92 (!) 105  Resp:  16  Temp:  98.1 F (36.7 C)  SpO2:  100%     General: elderly female in bed chronically ill  appearing HEENT: NCAT  Eyes: EOMI; sclera anicteric Neck: no JVD; trachea midline  Heart: tachycardia; S1S2 ; no rub Lungs: clear to auscultation and unlabored Abdomen: soft/NT/ND; normal bowel sounds Extremities: legs and arms are wrapped with bandage/gauze; no pitting edema  Neuro: delayed responses to simple questions  GU - has a foley   Assessment/Plan:  # CKD stage V vs. AKI on CKD stage IV - CKD appears to be secondary to DM and HTN as well as possible chronic NSAID use.  Current Ischemic insults with hypotension and  pre-renal insults of decreased PO intake.  Her recent baseline creatinine was 3.5 with her PCP per primary team discussion with them.  They report that she has had worsening renal function for the past few months (to the new reported baseline of 3.5).  Unclear of timing of labs - She is reported above as being on relafen - an NSAID - would not resume on discharge if she is actually taking this  - Continue fluids for now as well as foley catheter.  No overt overload.  Can stop if she develops shortness of breath.   - She does not appear to be a good candidate for dialysis should her renal failure progress to the point of needing it  # A fib  - on metoprolol   # DM  - Per primary team   # UTI - Ceftriaxone x 3 days   # Hx HTN - Now with hypotension; her metoprolol is directed at a fib   # Mechanical fall  - Per primary team   Claudia Desanctis 07/09/2018, 8:55 PM

## 2018-07-09 NOTE — Progress Notes (Signed)
Orthopedic Tech Progress Note Patient Details:  Carla Robinson [birth year] [id number]  Ortho Devices Type of Ortho Device: Haematologist Ortho Device/Splint Location: bilateral Ortho Device/Splint Interventions: Adjustment, Application, Ordered   Post Interventions Patient Tolerated: Well Instructions Provided: Care of device, Adjustment of device   Janit Pagan 07/09/2018, 1:30 PM

## 2018-07-09 NOTE — Progress Notes (Signed)
Pt son updated on pt condition. Will call in am to speak with physician.

## 2018-07-09 NOTE — Evaluation (Signed)
Physical Therapy Evaluation Patient Details Name: Carla Robinson MRN: [id number] DOB: [birth year] Today's Date: 07/09/2018   History of Present Illness   84 y.o. female with past medical history significant for hypertension, diabetes, chronic kidney disease was diagnosed with atrial fibrillation in April of this year who now presents with weakness for the past couple of weeks and a fall     Clinical Impression  Pt admitted with above diagnosis. Pt currently with functional limitations due to the deficits listed below (see PT Problem List). PTA pt living with family, ambulating short distances in household with RW, denies but 1 fall recently, states her family will be home with her at all times. Today, min A of 2 to stand and guard to transfer to chair. Weaker than baseline per her report, however adamant to return home. With progress and family support at home, HHPT obtainable goal with subsquent sessions.  Pt will benefit from skilled PT to increase their independence and safety with mobility to allow discharge to the venue listed below.       Follow Up Recommendations Home health PT;Supervision/Assistance - 24 hour(with family support)    Equipment Recommendations  None recommended by PT    Recommendations for Other Services       Precautions / Restrictions Precautions Precautions: Fall;Other (comment) Precaution Comments: multiple skin tears Restrictions Weight Bearing Restrictions: No      Mobility  Bed Mobility Overal bed mobility: Needs Assistance Bed Mobility: Supine to Sit     Supine to sit: Mod assist     General bed mobility comments: Assist to elevate trunk and use of pad to scoot hips to EOB.  Transfers Overall transfer level: Needs assistance Equipment used: Rolling walker (2 wheeled) Transfers: Sit to/from [facility] Sit to Stand: +2 physical assistance;Min assist Stand pivot transfers: Min guard       General transfer comment: Assist to  power up from bed. Once in standing, able to pivot from bed to recliner with min guard with RW.  Ambulation/Gait                Stairs            Wheelchair Mobility    Modified Rankin (Stroke Patients Only)       Balance Overall balance assessment: Needs assistance   Sitting balance-Leahy Scale: Fair       Standing balance-Leahy Scale: Poor                               Pertinent Vitals/Pain      Home Living Family/patient expects to be discharged to:: Private residence Living Arrangements: Children Available Help at Discharge: Family;Available 24 hours/day Type of Home: House Home Access: Ramped entrance     Home Layout: Able to live on main level with bedroom/bathroom(walks around house to lower level entrance for walk in showe) Home Equipment: [place] - standard;Walker - 2 wheels;Toilet riser;Bedside commode;Shower seat(rollator)      Prior Function Level of Independence: Independent with assistive device(s)         Comments: Pt reports she is independent with ADLs with RW. She waits to shower until family members are in the house.  She lives with adult daughter who has cognitive impairments. However, she has two sons, daughter in law and grandchildren who can provide 24/7 assist.     Hand Dominance        Extremity/Trunk Assessment   Upper Extremity Assessment  Upper Extremity Assessment: Generalized weakness    Lower Extremity Assessment Lower Extremity Assessment: Generalized weakness       Communication   Communication: No difficulties  Cognition Arousal/Alertness: Awake/alert Behavior During Therapy: WFL for tasks assessed/performed Overall Cognitive Status: Within Functional Limits for tasks assessed                                        General Comments      Exercises     Assessment/Plan    PT Assessment Patient needs continued PT services  PT Problem List Decreased strength       PT  Treatment Interventions DME instruction;Gait training;Stair training;Functional mobility training;Therapeutic activities    PT Goals (Current goals can be found in the Care Plan section)  Acute Rehab PT Goals Patient Stated Goal: to get stronger PT Goal Formulation: With patient Time For Goal Achievement: 07/23/18 Potential to Achieve Goals: Fair    Frequency Min 3X/week   Barriers to discharge        Co-evaluation   Reason for Co-Treatment: For patient/therapist safety;To address functional/ADL transfers   OT goals addressed during session: ADL's and self-care       AM-PAC PT "6 Clicks" Mobility  Outcome Measure Help needed turning from your back to your side while in a flat bed without using bedrails?: A Little Help needed moving from lying on your back to sitting on the side of a flat bed without using bedrails?: A Little Help needed moving to and from a bed to a chair (including a wheelchair)?: A Little Help needed standing up from a chair using your arms (e.g., wheelchair or bedside chair)?: A Little Help needed to walk in hospital room?: A Lot Help needed climbing 3-5 steps with a railing? : A Lot 6 Click Score: 16    End of Session Equipment Utilized During Treatment: Gait belt Activity Tolerance: Patient tolerated treatment well Patient left: with call bell/phone within reach;with chair alarm set;in chair Nurse Communication: Mobility status PT Visit Diagnosis: Unsteadiness on feet (R26.81)    Time: 1550-1620 PT Time Calculation (min) (ACUTE ONLY): 30 min   Charges:   PT Evaluation $PT Eval Moderate Complexity: 1 Mod         Reinaldo Berber, PT, DPT Acute Rehabilitation Services Pager: [phone] Office: [phone]    Reinaldo Berber 07/09/2018, 4:34 PM

## 2018-07-09 NOTE — Progress Notes (Signed)
PROGRESS NOTE    Carla Robinson  GYK:[id number] DOB: 08-Feb-1934 DOA: 07/08/2018 PCP: Cher Nakai, MD   Brief Narrative:  HPI On 07/08/2018 by Dr. Emmit Pomfret Carla Robinson is an 84 y.o. female with past medical history significant for hypertension, diabetes, chronic kidney disease was diagnosed with atrial fibrillation in April of this year who now presents with weakness for the past couple of weeks and a fall earlier today.  Patient states that she was doing well until she was placed on Eliquis for her atrial fibrillation.  She states that since she has been taking her Eliquis she has had fatigue, anorexia, a rash and generally not feeling well.  Patient states she stopped taking her Eliquis 3 weeks ago however since that time she has had persistent nausea and anorexia, so states she has had significantly decreased p.o. intake since then.  Patient was brought into [facility] yesterday when she had a mechanical fall.  Patient denies any chest pain or dizziness or palpitations before the fall.  However she does say that she is just been very tired and has not been very active.  She notes that prior to 3 weeks ago she was able to take care of her adult daughter who has cognitive delay, do her own grocery shopping and cooking.  Over the past 2 weeks her sister-in-law has been bringing her food because she has been so tired and fatigued.  At [facility] patient was noted to be in significantly worsened kidney function with a creatinine of 5.5 up from 3.5.  Head CT at that time was negative for any acute intracranial process.  She was transferred from [facility] due to significant worsening of her renal failure.  He of systems is notable for a rash on her anterior forearms that she has been scratching while she has been on Eliquis leading to ecchymoses on forearms.  She notes rash is improved since she has been off the Eliquis.  Denies any fevers or chills.  Denies any  shortness of breath.  No chest pain no DOE no palpitations.  She does note frequent ecchymoses on legs and forearms since starting the Eliquis where she does not remember actually hitting herself.  No abdominal pain.  No nausea vomiting or diarrhea. Assessment & Plan   Acute kidney injury, likely on chronic kidney disease, stage 4 -Creatine was 5.5 at outside facility (baseline around 3.5) -Suspect AKI secondary to poor oral intake and NSAID use -Continue IVF -Creatinine down to 3.71 -monitor intake/output, daily weights  Asymptomatic bacteriuria -Does not complain of dysuria -UA large leukocytes, many bacteria, 21-50 WBC -no urine culture sent on admission -give that patient is asymptomatic, will not start antibiotics  Mild hyponatremia -Likely secondary to poor oral intake -Continue IVF and monitor BMP  Atrial fibrillation -Patient feels her her symptoms are due to Eliquis and does not want to continue taking this medication or restart any anticoagulation at this point -Continue follow-up for rate control  Fall with physical deconditioning -On examination, patient does appear to be very weak and deconditioned -Will consult PT and OT -Patient does not wish to go SNF as she lives with her daughter who is cognitively impaired -CT head at outside facility was negative for acute changes  Essential Hypertension -BP on the soft sided -Continue metoprolol with holding parameters -Lasix held  Diabetes mellitus, type II -Continue insulin sliding scale with CBG monitoring  Chronic diastolic heart failure -Appears to be euvolemic at this time -Lasix held -  Monitor intake and output, daily weights  GERD -Continue PPI  Skin abrasion/lacerations/Wounds -patient with fall and was on Eliquis -Wound care consulted and appreciated, recommended:  1. Add low air loss mattress for moisture and pressure redistribution 2. Add enzymatic debridement to the bilateral ischial wounds to clean  up non viable tissue, change daily 3. Add chair pressure redistribution pad for patient when up in the chair/send home for use 4. Add Unna's boots bilaterally for compression therapy for venous stasis dx.; change 2x wk will need HHRN for continued therapy and assessment.  5. Add Gerhardt's butt cream for protection of the sacrum that is vunerable to moisture.  6. Add xeroform to skin tears of the right arm, change every other day.    DVT Prophylaxis  heparin  Code Status: Full  Family Communication: None at bedside  Disposition Plan: Admitted. Continue to monitor. Suspect home with [place] in the next 1-2 days  Consultants None  Procedures  None  Antibiotics   Anti-infectives (From admission, onward)   None      Subjective:   Carla Robinson seen and examined today.  States feels very weak and has not left her house in months.  She takes care for mentally challenged 39 something-year-old daughter.  Denies current chest pain, shortness of breath, abdominal pain, nausea or vomiting, diarrhea constipation, dizziness or headache.  States she has been falling.  She attributes her current state of health due to Eliquis.  Also states that she has had a rash for approximately 3 months.  Objective:   Vitals:   07/08/18 2200 07/08/18 2251 07/09/18 0606 07/09/18 0607  BP:  101/67 (!) 89/51 (!) 92/54  Pulse:  90 96 92  Resp:   16   Temp: (!) 97.5 F (36.4 C)  98.2 F (36.8 C)   TempSrc: Axillary  Oral   SpO2:   100%   Weight:      Height:        Intake/Output Summary (Last 24 hours) at 07/09/2018 1304 Last data filed at 07/09/2018 8756 Gross per 24 hour  Intake 1090 ml  Output 700 ml  Net 390 ml   Filed Weights   07/08/18 1406  Weight: 84.1 kg    Exam  General: Well developed, well nourished, NAD, appears stated age  HEENT: NCAT, mucous membranes moist.   Neck: Supple  Cardiovascular: S1 S2 auscultated, irregular  Respiratory: Clear to auscultation bilaterally with equal  chest rise  Abdomen: Soft, nontender, nondistended, + bowel sounds  Extremities: warm dry without cyanosis clubbing. LE edema.   Neuro: AAOx3, nonfocal  Skin: Multiple skin tears/abrasions, bruising.  Rash noted body wide except for torso.  Psych: Normal affect and demeanor    Data Reviewed: I have personally reviewed following labs and imaging studies  CBC: Recent Labs  Lab 07/08/18 1925  WBC 7.6  NEUTROABS 5.3  HGB 9.5*  HCT 29.8*  MCV 85.6  PLT 433   Basic Metabolic Panel: Recent Labs  Lab 07/08/18 1925 07/09/18 0312  NA 129* 130*  K 4.2 4.3  CL 98 101  CO2 17* 17*  GLUCOSE 142* 108*  BUN 123* 115*  CREATININE 4.41* 3.71*  CALCIUM 8.8* 8.6*  MG 1.3*  --    GFR: Estimated Creatinine Clearance: 11.8 mL/min (A) (by C-G formula based on SCr of 3.71 mg/dL (H)). Liver Function Tests: Recent Labs  Lab 07/08/18 1925  AST 21  ALT 16  ALKPHOS 93  BILITOT 1.7*  PROT 5.6*  ALBUMIN 2.2*  No results for input(s): LIPASE, AMYLASE in the last 168 hours. No results for input(s): AMMONIA in the last 168 hours. Coagulation Profile: No results for input(s): INR, PROTIME in the last 168 hours. Cardiac Enzymes: No results for input(s): CKTOTAL, CKMB, CKMBINDEX, TROPONINI in the last 168 hours. BNP (last 3 results) No results for input(s): PROBNP in the last 8760 hours. HbA1C: Recent Labs    07/08/18 1925  HGBA1C 5.9*   CBG: Recent Labs  Lab 07/08/18 1659 07/08/18 2316 07/09/18 0738 07/09/18 1153  GLUCAP 91 115* 87 162*   Lipid Profile: No results for input(s): CHOL, HDL, LDLCALC, TRIG, CHOLHDL, LDLDIRECT in the last 72 hours. Thyroid Function Tests: Recent Labs    07/08/18 1925  TSH 1.565   Anemia Panel: No results for input(s): VITAMINB12, FOLATE, FERRITIN, TIBC, IRON, RETICCTPCT in the last 72 hours. Urine analysis:    Component Value Date/Time   COLORURINE YELLOW 07/08/2018 2320   APPEARANCEUR HAZY (A) 07/08/2018 2320   LABSPEC 1.013  07/08/2018 2320   PHURINE 5.0 07/08/2018 2320   GLUCOSEU NEGATIVE 07/08/2018 2320   HGBUR MODERATE (A) 07/08/2018 2320   BILIRUBINUR NEGATIVE 07/08/2018 2320   KETONESUR NEGATIVE 07/08/2018 2320   PROTEINUR NEGATIVE 07/08/2018 2320   NITRITE NEGATIVE 07/08/2018 2320   LEUKOCYTESUR LARGE (A) 07/08/2018 2320   Sepsis Labs: @LABRCNTIP (procalcitonin:4,lacticidven:4)  )No results found for this or any previous visit (from the past 240 hour(s)).    Radiology Studies: No results found.   Scheduled Meds: . ALPRAZolam  0.5 mg Oral BID  . atorvastatin  40 mg Oral QHS  . collagenase   Topical Daily  . Gerhardt's butt cream   Topical BID  . heparin  5,000 Units Subcutaneous Q8H  . insulin aspart  0-9 Units Subcutaneous TID WC  . metoprolol tartrate  12.5 mg Oral BID  . mupirocin ointment  1 application Topical BID  . pantoprazole  40 mg Oral Daily   Continuous Infusions: . sodium chloride 125 mL/hr at 07/09/18 0436     LOS: 1 day   Time Spent in minutes   45 minutes  Carla Robinson D.O. on 07/09/2018 at 1:04 PM  Between 7am to 7pm - Please see pager noted on [email or web address]  After 7pm go to [email or web address]  And look for the night coverage person covering for me after hours  [facility] Office  [phone]

## 2018-07-09 NOTE — Progress Notes (Signed)
Pt has Foley catheter placed prior to pt arrival to [facility]. Per day shift RN, Foley was placed in [facility]. For urinary retention. Pt also has multiple wounds. Unstainable pressure injuries on bilateral buttocks and MASD vs stage 2 pressure injury on sacrum.  NP on call notified.

## 2018-07-09 NOTE — Progress Notes (Addendum)
Initial Nutrition Assessment  DOCUMENTATION CODES:   Non-severe (moderate) malnutrition in context of chronic illness  INTERVENTION:   -MVI with minerals daily -30 ml Prostat TID, each supplement provides 100 kcals and 15 grams protein -Magic cup TID with meals, each supplement provides 290 kcal and 9 grams of protein -Hormel Shake TID with meals, each supplement provides 520 kcals and 22 grams protein -Feeding assistance with meals -If poor po intake persists, consider placement of small bore feeding tube (ex cortrak)  NUTRITION DIAGNOSIS:   Moderate Malnutrition related to chronic illness(CKD) as evidenced by percent weight loss, mild fat depletion, moderate fat depletion, mild muscle depletion, moderate muscle depletion, edema.  GOAL:   Patient will meet greater than or equal to 90% of their needs  MONITOR:   PO intake, Supplement acceptance, Labs, Weight trends, Skin, I & O's  REASON FOR ASSESSMENT:   Consult Assessment of nutrition requirement/status  ASSESSMENT:   Carla Robinson is an 84 y.o. female with past medical history significant for hypertension, diabetes, chronic kidney disease was diagnosed with atrial fibrillation in April of this year who now presents with weakness for the past couple of weeks and a fall earlier today.  Pt admitted with acute on chronic renal failure.   Reviewed I/O's: +50 ml x 24 hours  UOP: 700 ml x 24 hours  Pt very somnolent at time of visit and did not respond to touch or voice.   Per meal completion records, po 50%. Lunch tray delivered during RD visit and both RD and nutritional services ambassador unable to arouse pt to eat.   Per wt hx; pt has experienced a 11.8% wt loss over the past 2 months, which is significant for time frame. Pt also with moderate edema in lower extremities, which is likely masking further weight loss and muscle depletions. Pt also with multiple areas of skin breakdown, for which she has increased nutrient  needs.  Unless pt's mental status improves, suspect pt will be unable to meet nutritional needs via PO intake. RD will continue to monitor PO and supplement intake, however, may benefit from temporary small bore feeding tube placement, such as cortrak.    Labs reviewed: Na: 130, CBGS: 85-115 (inpatient orders for glycemic control are 0-9 units insulin aspart TID with meals).   NUTRITION - FOCUSED PHYSICAL EXAM:    Most Recent Value  Orbital Region  Mild depletion  Upper Arm Region  Moderate depletion  Thoracic and Lumbar Region  No depletion  Buccal Region  Mild depletion  Temple Region  Mild depletion  Clavicle Bone Region  No depletion  Clavicle and Acromion Bone Region  No depletion  Scapular Bone Region  No depletion  Dorsal Hand  Moderate depletion  Patellar Region  Mild depletion  Anterior Thigh Region  Mild depletion  Posterior Calf Region  No depletion  Edema (RD Assessment)  Moderate  Hair  Reviewed  Eyes  Reviewed  Mouth  Reviewed  Skin  Reviewed  Nails  Reviewed       Diet Order:   Diet Order            Diet renal/carb modified with fluid restriction Diet-HS Snack? Nothing; Fluid restriction: 1200 mL Fluid; Room service appropriate? Yes with Assist; Fluid consistency: Thin  Diet effective now              EDUCATION NEEDS:   No education needs have been identified at this time  Skin:  Skin Assessment: Skin Integrity Issues: Skin Integrity Issues::  Unstageable, Other (Comment) Unstageable: lt and rt buttocks Other: MASD coccyx and buttocks; non pressure wound to lt heel  Last BM:  07/08/18  Height:   Ht Readings from Last 1 Encounters:  07/08/18 5\' 2"  (1.575 m)    Weight:   Wt Readings from Last 1 Encounters:  07/08/18 84.1 kg    Ideal Body Weight:  50 kg  BMI:  Body mass index is 33.93 kg/m.  Estimated Nutritional Needs:   Kcal:  1500-1700  Protein:  85-100 grams  Fluid:  1.2 L    Megumi Treaster A. Jimmye Norman, RD, LDN, [place] Registered  Dietitian II Certified Diabetes Care and Education Specialist Pager: [phone] After hours Pager: [phone]

## 2018-07-10 DIAGNOSIS — E440 Moderate protein-calorie malnutrition: Secondary | ICD-10-CM

## 2018-07-10 LAB — BASIC METABOLIC PANEL
Anion gap: 13 (ref 5–15)
BUN: 87 mg/dL — ABNORMAL HIGH (ref 8–23)
CO2: 17 mmol/L — ABNORMAL LOW (ref 22–32)
Calcium: 8.2 mg/dL — ABNORMAL LOW (ref 8.9–10.3)
Chloride: 106 mmol/L (ref 98–111)
Creatinine, Ser: 2.44 mg/dL — ABNORMAL HIGH (ref 0.44–1.00)
GFR calc Af Amer: 21 mL/min — ABNORMAL LOW (ref >60)
GFR calc non Af Amer: 18 mL/min — ABNORMAL LOW (ref >60)
Glucose, Bld: 114 mg/dL — ABNORMAL HIGH (ref 70–99)
Potassium: 4.2 mmol/L (ref 3.5–5.1)
Sodium: 136 mmol/L (ref 135–145)

## 2018-07-10 LAB — IRON AND TIBC
Iron: 47 ug/dL (ref 28–170)
Saturation Ratios: 19 % (ref 10.4–31.8)
TIBC: 244 ug/dL — ABNORMAL LOW (ref 250–450)
UIBC: 197 ug/dL

## 2018-07-10 LAB — GLUCOSE, CAPILLARY
Glucose-Capillary: 99 mg/dL (ref 70–99)
Glucose-Capillary: 143 mg/dL — ABNORMAL HIGH (ref 70–99)
Glucose-Capillary: 131 mg/dL — ABNORMAL HIGH (ref 70–99)
Glucose-Capillary: 124 mg/dL — ABNORMAL HIGH (ref 70–99)

## 2018-07-10 LAB — MAGNESIUM: Magnesium: 1.3 mg/dL — ABNORMAL LOW (ref 1.7–2.4)

## 2018-07-10 LAB — HEMOGLOBIN AND HEMATOCRIT, BLOOD
HCT: 27.9 % — ABNORMAL LOW (ref 36.0–46.0)
Hemoglobin: 8.7 g/dL — ABNORMAL LOW (ref 12.0–15.0)

## 2018-07-10 LAB — FERRITIN: Ferritin: 115 ng/mL (ref 11–307)

## 2018-07-10 MED ORDER — MAGNESIUM SULFATE 2 GM/50ML IV SOLN
2.0000 g | Freq: Once | INTRAVENOUS | Status: AC
  Administered 2018-07-10: 2 g via INTRAVENOUS
  Filled 2018-07-10: qty 50

## 2018-07-10 NOTE — Progress Notes (Signed)
[facility] Progress Note  Name: Carla Robinson MRN: [id number] DOB: Dec 02, 1934  Chief Complaint:  I fell   Subjective:  900 ml uop charted over 6/26. she refused to work with OT because was sleepy.  She has been on NS at 125/hr.  She thinks she's on the relafen but not sure of her pain pill's name  Review of systems:  Denies overt shortness of breath  Denies n/v   Intake/Output Summary (Last 24 hours) at 07/10/2018 1512 Last data filed at 07/10/2018 0408 Gross per 24 hour  Intake 2612.78 ml  Output 600 ml  Net 2012.78 ml    Vitals:  Vitals:   07/09/18 2236 07/10/18 0418 07/10/18 0736 07/10/18 1258  BP: 126/61  112/62 (!) 93/46  Pulse: (!) 118  (!) 106 98  Resp: 16  16 18   Temp: 97.6 F (36.4 C)  98 F (36.7 C) (P) 98.9 F (37.2 C)  TempSrc: Oral   (P) Oral  SpO2: 99%  96% 97%  Weight:  82.2 kg    Height:         Physical Exam:  General elderly female chronically ill appearing HEENT normocephalic atraumatic extraocular movements intact sclera anicteric Neck supple trachea midline Lungs reduced breath sounds and slightly increased work of breathing which improves with repositioning Heart tachycardia  Abdomen soft nontender nondistended Extremities no pitting edema Psych - more interactive today   Medications reviewed   Labs:  BMP Latest Ref Rng & Units 07/10/2018 07/09/2018 07/08/2018  Glucose 70 - 99 mg/dL 114(H) 108(H) 142(H)  BUN 8 - 23 mg/dL 87(H) 115(H) 123(H)  Creatinine 0.44 - 1.00 mg/dL 2.44(H) 3.71(H) 4.41(H)  Sodium 135 - 145 mmol/L 136 130(L) 129(L)  Potassium 3.5 - 5.1 mmol/L 4.2 4.3 4.2  Chloride 98 - 111 mmol/L 106 101 98  CO2 22 - 32 mmol/L 17(L) 17(L) 17(L)  Calcium 8.9 - 10.3 mg/dL 8.2(L) 8.6(L) 8.8(L)   TTE 04/2018 with LVEF 60-65% and unable to assess diastolic function    Assessment/Plan:   # AKI  - Current Ischemic insults with hypotension and pre-renal insults of decreased PO intake.   - Improving with supportive care    - Continue hydration as tolerated but will reduce rate to 75 ml/hr - Would hold her relafen and not resume on discharge   # CKD - appears at least stage IV per report  - CKD appears to be secondary to DM and HTN as well as possible chronic NSAID use.  Per report she has had worsening renal function for the past few months (to the reported level of Cr 3.5).  Timing of labs unclear  - She does not appear to be a good candidate for dialysis should her renal failure at some point progress to the point of needing it  # A fib  - on metoprolol   # DM  - Per primary team   # UTI - Ceftriaxone x 3 days   # Hx HTN  - Now with hypotension; her metoprolol is directed at a fib   # Mechanical fall  - Per primary team   # Anemia  - Check iron stores.  Secondary in part to CKD.  No acute indication for PRBC's   Claudia Desanctis, MD 07/10/2018 3:12 PM

## 2018-07-10 NOTE — Progress Notes (Signed)
Nutrition Follow-up   RD working remotely.  DOCUMENTATION CODES:   Non-severe (moderate) malnutrition in context of chronic illness  INTERVENTION:  Continue MVI with minerals daily.  Continue 30 ml Prostat TID, each supplement provides 100 kcals and 15 grams protein  Continue Magic cup TID with meals, each supplement provides 290 kcal and 9 grams of protein  Continue Hormel Shake TID with meals, each supplement provides 520 kcals and 22 grams protein  Encourage adequate PO intake.   NUTRITION DIAGNOSIS:   Moderate Malnutrition related to chronic illness(CKD) as evidenced by percent weight loss, mild fat depletion, moderate fat depletion, mild muscle depletion, moderate muscle depletion, edema; ongoing  GOAL:   Patient will meet greater than or equal to 90% of their needs; met  MONITOR:   PO intake, Supplement acceptance, Labs, Weight trends, Skin, I & O's  REASON FOR ASSESSMENT:   Consult Assessment of nutrition requirement/status  ASSESSMENT:   Carla Robinson is an 84 y.o. female with past medical history significant for hypertension, diabetes, chronic kidney disease was diagnosed with atrial fibrillation in April of this year who now presents with weakness for the past couple of weeks and a fall earlier today.   Pt admitted with acute on chronic renal failure. Pt with stage 4 CKD likely secondary to DM and HTN per MD.   Meal completion 100% today. Pt currently has nutritional supplements ordered and has been consuming them. RD to continue with current orders to aid in adequate caloric and protein needs as well as in wound healing. Pt encouraged to eat her food at meals and to consume her supplements.   Labs and medications reviewed. Magnesium low at 1.3. supplemented with magnesium sulfate.   Diet Order:   Diet Order            Diet renal/carb modified with fluid restriction Diet-HS Snack? Nothing; Fluid restriction: 1200 mL Fluid; Room service appropriate? Yes with  Assist; Fluid consistency: Thin  Diet effective now              EDUCATION NEEDS:   No education needs have been identified at this time  Skin:  Skin Assessment: Skin Integrity Issues: Skin Integrity Issues:: Unstageable, Other (Comment) Unstageable: L and R buttocks Other: MASD coccyx and buttocks, non pressure wound to L heel  Last BM:  6/26  Height:   Ht Readings from Last 1 Encounters:  07/08/18 5' 2"  (1.575 m)    Weight:   Wt Readings from Last 1 Encounters:  07/10/18 82.2 kg    Ideal Body Weight:  50 kg  BMI:  Body mass index is 33.15 kg/m.  Estimated Nutritional Needs:   Kcal:  1500-1700  Protein:  85-100 grams  Fluid:  1.2 L    Corrin Parker, MS, RD, LDN Pager # [phone] After hours/ weekend pager # [phone]

## 2018-07-10 NOTE — Progress Notes (Signed)
OT Cancellation Note  Patient Details Name: MONIE SHERE MRN: [id number] DOB: 1934-05-12   Cancelled Treatment:    Reason Eval/Treat Not Completed: Other (comment).  Pt. Asleep, difficult to arouse for conversation and participation.  Declined skilled OT stating "I want to keep sleeping". Will attempt back as able.   Janice Coffin, COTA/L 07/10/2018, 2:08 PM

## 2018-07-10 NOTE — Progress Notes (Addendum)
PROGRESS NOTE    Carla Robinson  QQV:[id number] DOB: April 26, 1934 DOA: 07/08/2018 PCP: Cher Nakai, MD   Brief Narrative:  HPI On 07/08/2018 by Dr. Emmit Pomfret Carla Robinson is an 84 y.o. female with past medical history significant for hypertension, diabetes, chronic kidney disease was diagnosed with atrial fibrillation in April of this year who now presents with weakness for the past couple of weeks and a fall earlier today.  Patient states that she was doing well until she was placed on Eliquis for her atrial fibrillation.  She states that since she has been taking her Eliquis she has had fatigue, anorexia, a rash and generally not feeling well.  Patient states she stopped taking her Eliquis 3 weeks ago however since that time she has had persistent nausea and anorexia, so states she has had significantly decreased p.o. intake since then.  Patient was brought into [facility] yesterday when she had a mechanical fall.  Patient denies any chest pain or dizziness or palpitations before the fall.  However she does say that she is just been very tired and has not been very active.  She notes that prior to 3 weeks ago she was able to take care of her adult daughter who has cognitive delay, do her own grocery shopping and cooking.  Over the past 2 weeks her sister-in-law has been bringing her food because she has been so tired and fatigued.  At [facility] patient was noted to be in significantly worsened kidney function with a creatinine of 5.5 up from 3.5.  Head CT at that time was negative for any acute intracranial process.  She was transferred from [facility] due to significant worsening of her renal failure.  He of systems is notable for a rash on her anterior forearms that she has been scratching while she has been on Eliquis leading to ecchymoses on forearms.  She notes rash is improved since she has been off the Eliquis.  Denies any fevers or chills.  Denies any  shortness of breath.  No chest pain no DOE no palpitations.  She does note frequent ecchymoses on legs and forearms since starting the Eliquis where she does not remember actually hitting herself.  No abdominal pain.  No nausea vomiting or diarrhea. Assessment & Plan   Acute kidney injury chronic kidney disease, stage 4-5 -Creatine was 5.5 at outside facility (baseline around 3.5- as per conversation with PCP) -Suspect AKI secondary to poor oral intake and NSAID use -Continue IVF -Creatinine down to 2.44 (wonder if creatinine will continue to improve) -monitor intake/output, daily weights -Discussed with PCP, he discontinued lasix as an outpatient given her worsening kidney function and her GFR in the past several months has been around 11 (corresponding with stage 5). Referral to outpatient nephrology was made and currently pending -Personally, given her current situation, doubt patient is a candidate for HD.  -Patient still makes a good amount of urine.  -Nephrology consulted and appreciated.  Recommend continuing IV fluids.  Do not resume NSAIDs on discharge.  Does not feel pain candidate is a good candidate for dialysis.  Asymptomatic bacteriuria -Does not complain of dysuria -UA large leukocytes, many bacteria, 21-50 WBC -no urine culture sent on admission -give that patient is asymptomatic, would not start antibiotics -however, she was started on ceftriaxone per renal  Mild hyponatremia -Likely secondary to poor oral intake -Improving with IVF -Continue to monitor BMP  Atrial fibrillation -Patient feels her her symptoms are due to Eliquis and  does not want to continue taking this medication or restart any anticoagulation at this point -Continue follow-up for rate control  Fall with physical deconditioning -On examination, patient does appear to be very weak and deconditioned -Patient does not wish to go SNF as she lives with her daughter who is cognitively impaired -CT head at  outside facility was negative for acute changes -PT/OT rec HH  Essential Hypertension -BP on the soft sided -Continue metoprolol with holding parameters -Lasix held  Diabetes mellitus, type II -Continue insulin sliding scale with CBG monitoring  Chronic diastolic heart failure -Appears to be euvolemic at this time -Lasix held (was discontinued by PCP) -Monitor intake and output, daily weights  GERD -Continue PPI  Skin abrasion/lacerations/Wounds -patient with fall and was on Eliquis -Wound care consulted and appreciated, recommended:  1. Add low air loss mattress for moisture and pressure redistribution 2. Add enzymatic debridement to the bilateral ischial wounds to clean up non viable tissue, change daily 3. Add chair pressure redistribution pad for patient when up in the chair/send home for use 4. Add Unna's boots bilaterally for compression therapy for venous stasis dx.; change 2x wk will need HHRN for continued therapy and assessment.  5. Add Gerhardt's butt cream for protection of the sacrum that is vunerable to moisture.  6. Add xeroform to skin tears of the right arm, change every other day.   Malnutrition -Albumin 2.2 -Nutrition consulted  Hypomagnesemia -Magnesium 1.3, continue to replace IV and monitor  DVT Prophylaxis  heparin  Code Status: Full  Family Communication: None at bedside. Left message for son.  Disposition Plan: Admitted. Continue to monitor. Suspect home with [facility] in the next 24 hours.  Consultants Nephrology  Procedures  None  Antibiotics   Anti-infectives (From admission, onward)   Start     Dose/Rate Route Frequency Ordered Stop   07/09/18 2115  cefTRIAXone (ROCEPHIN) 1 g in sodium chloride 0.9 % 100 mL IVPB     1 g 200 mL/hr over 30 Minutes Intravenous Every 24 hours 07/09/18 2109 07/12/18 2114      Subjective:   Carla Robinson seen and examined today.  Continues to feel weak.  Has no specific complaints.  Denies chest pain,  shortness of breath, abdominal pain, nausea or vomiting, diarrhea constipation, dizziness or headache.  Objective:   Vitals:   07/09/18 2211 07/09/18 2236 07/10/18 0418 07/10/18 0736  BP: (!) 104/54 126/61  112/62  Pulse: 94 (!) 118  (!) 106  Resp:  16  16  Temp:  97.6 F (36.4 C)  98 F (36.7 C)  TempSrc:  Oral    SpO2:  99%  96%  Weight:   82.2 kg   Height:        Intake/Output Summary (Last 24 hours) at 07/10/2018 1232 Last data filed at 07/10/2018 0408 Gross per 24 hour  Intake 3056.78 ml  Output 900 ml  Net 2156.78 ml   Filed Weights   07/08/18 1406 07/10/18 0418  Weight: 84.1 kg 82.2 kg   Exam  General: Well developed, chronically ill-appearing, NAD  HEENT: NCAT, mucous membranes moist.   Cardiovascular: S1 S2 auscultated, irregular  Respiratory: Clear to auscultation bilaterally with equal chest rise  Abdomen: Soft, obese, nontender, nondistended, + bowel sounds  Extremities: warm dry without cyanosis clubbing.  LE Unna boots  Neuro: AAOx3, nonfocal  Skin: Multiple skin tears and abrasions, bruising.  Psych: Flat   Data Reviewed: I have personally reviewed following labs and imaging studies  CBC: Recent  Labs  Lab 07/08/18 1925 07/10/18 0808  WBC 7.6  --   NEUTROABS 5.3  --   HGB 9.5* 8.7*  HCT 29.8* 27.9*  MCV 85.6  --   PLT 170  --    Basic Metabolic Panel: Recent Labs  Lab 07/08/18 1925 07/09/18 0312 07/10/18 0808  NA 129* 130* 136  K 4.2 4.3 4.2  CL 98 101 106  CO2 17* 17* 17*  GLUCOSE 142* 108* 114*  BUN 123* 115* 87*  CREATININE 4.41* 3.71* 2.44*  CALCIUM 8.8* 8.6* 8.2*  MG 1.3*  --  1.3*   GFR: Estimated Creatinine Clearance: 17.7 mL/min (A) (by C-G formula based on SCr of 2.44 mg/dL (H)). Liver Function Tests: Recent Labs  Lab 07/08/18 1925  AST 21  ALT 16  ALKPHOS 93  BILITOT 1.7*  PROT 5.6*  ALBUMIN 2.2*   No results for input(s): LIPASE, AMYLASE in the last 168 hours. No results for input(s): AMMONIA in the  last 168 hours. Coagulation Profile: No results for input(s): INR, PROTIME in the last 168 hours. Cardiac Enzymes: No results for input(s): CKTOTAL, CKMB, CKMBINDEX, TROPONINI in the last 168 hours. BNP (last 3 results) No results for input(s): PROBNP in the last 8760 hours. HbA1C: Recent Labs    07/08/18 1925  HGBA1C 5.9*   CBG: Recent Labs  Lab 07/09/18 1153 07/09/18 1612 07/09/18 2250 07/10/18 0753 07/10/18 1225  GLUCAP 162* 164* 160* 99 143*   Lipid Profile: No results for input(s): CHOL, HDL, LDLCALC, TRIG, CHOLHDL, LDLDIRECT in the last 72 hours. Thyroid Function Tests: Recent Labs    07/08/18 1925  TSH 1.565   Anemia Panel: No results for input(s): VITAMINB12, FOLATE, FERRITIN, TIBC, IRON, RETICCTPCT in the last 72 hours. Urine analysis:    Component Value Date/Time   COLORURINE YELLOW 07/08/2018 2320   APPEARANCEUR HAZY (A) 07/08/2018 2320   LABSPEC 1.013 07/08/2018 2320   PHURINE 5.0 07/08/2018 2320   GLUCOSEU NEGATIVE 07/08/2018 2320   HGBUR MODERATE (A) 07/08/2018 2320   BILIRUBINUR NEGATIVE 07/08/2018 2320   KETONESUR NEGATIVE 07/08/2018 2320   PROTEINUR NEGATIVE 07/08/2018 2320   NITRITE NEGATIVE 07/08/2018 2320   LEUKOCYTESUR LARGE (A) 07/08/2018 2320   Sepsis Labs: @LABRCNTIP (procalcitonin:4,lacticidven:4)  )No results found for this or any previous visit (from the past 240 hour(s)).    Radiology Studies: No results found.   Scheduled Meds: . ALPRAZolam  0.5 mg Oral BID  . atorvastatin  40 mg Oral QHS  . collagenase   Topical Daily  . feeding supplement (PRO-STAT SUGAR FREE 64)  30 mL Oral TID  . Gerhardt's butt cream   Topical BID  . heparin  5,000 Units Subcutaneous Q8H  . insulin aspart  0-9 Units Subcutaneous TID WC  . metoprolol tartrate  12.5 mg Oral BID  . multivitamin with minerals  1 tablet Oral Daily  . mupirocin ointment  1 application Topical BID  . pantoprazole  40 mg Oral Daily  . sodium chloride flush  10-40 mL  Intracatheter Q12H   Continuous Infusions: . sodium chloride 125 mL/hr at 07/10/18 0754  . cefTRIAXone (ROCEPHIN)  IV 1 g (07/09/18 2210)  . magnesium sulfate bolus IVPB 2 g (07/10/18 1147)     LOS: 2 days   Time Spent in minutes   30 minutes  Aveion Nguyen D.O. on 07/10/2018 at 12:32 PM  Between 7am to 7pm - Please see pager noted on [email or web address]  After 7pm go to [email or web address]  And look for the night coverage  person covering for me after hours  [facility] Office  [phone]

## 2018-07-11 LAB — GLUCOSE, CAPILLARY
Glucose-Capillary: 103 mg/dL — ABNORMAL HIGH (ref 70–99)
Glucose-Capillary: 102 mg/dL — ABNORMAL HIGH (ref 70–99)
Glucose-Capillary: 113 mg/dL — ABNORMAL HIGH (ref 70–99)

## 2018-07-11 LAB — MAGNESIUM: Magnesium: 1.8 mg/dL (ref 1.7–2.4)

## 2018-07-11 LAB — BASIC METABOLIC PANEL
Anion gap: 9 (ref 5–15)
BUN: 80 mg/dL — ABNORMAL HIGH (ref 8–23)
CO2: 18 mmol/L — ABNORMAL LOW (ref 22–32)
Calcium: 8.3 mg/dL — ABNORMAL LOW (ref 8.9–10.3)
Chloride: 108 mmol/L (ref 98–111)
Creatinine, Ser: 2.08 mg/dL — ABNORMAL HIGH (ref 0.44–1.00)
GFR calc Af Amer: 25 mL/min — ABNORMAL LOW (ref >60)
GFR calc non Af Amer: 22 mL/min — ABNORMAL LOW (ref >60)
Glucose, Bld: 111 mg/dL — ABNORMAL HIGH (ref 70–99)
Potassium: 4.3 mmol/L (ref 3.5–5.1)
Sodium: 135 mmol/L (ref 135–145)

## 2018-07-11 LAB — HEMOGLOBIN AND HEMATOCRIT, BLOOD
HCT: 27.4 % — ABNORMAL LOW (ref 36.0–46.0)
Hemoglobin: 8.2 g/dL — ABNORMAL LOW (ref 12.0–15.0)

## 2018-07-11 MED ORDER — SODIUM CHLORIDE 0.9 % IV SOLN
1.0000 g | Freq: Once | INTRAVENOUS | Status: AC
  Administered 2018-07-11: 1 g via INTRAVENOUS
  Filled 2018-07-11: qty 10

## 2018-07-11 MED ORDER — GERHARDT'S BUTT CREAM: 1.0000 "application " | TOPICAL_CREAM | Freq: Two times a day (BID) | CUTANEOUS | 0 refills | Status: AC

## 2018-07-11 MED ORDER — ADULT MULTIVITAMIN W/MINERALS CH: 1.0000 | ORAL_TABLET | Freq: Every day | ORAL | Status: AC

## 2018-07-11 MED ORDER — COLLAGENASE 250 UNIT/GM EX OINT: TOPICAL_OINTMENT | Freq: Every day | CUTANEOUS | 0 refills | Status: AC

## 2018-07-11 NOTE — Progress Notes (Signed)
Pt discharged with family. Pt a/o X 4, VSS, showing no signs of distress. All belongings returned to patient. MD aware of discharge. RN to continue to monitor.

## 2018-07-11 NOTE — TOC Transition Note (Signed)
Transition of Care [facility]) - CM/SW Discharge Note   Patient Details  Name: Carla Robinson MRN: [id number] Date of Birth: 1934/07/18  Transition of [facility] CM/SW Contact:  Carles Collet, RN Phone Number: 07/11/2018, 11:39 AM   Clinical Narrative:    Patient states that she is from home, has help of two sons and grandchildren at home. She states that she drove PTA, and will have reliable help from family after DC. She states that she has been active for [facility] with [facility] and would like to resume services. Referral made to [facility] on call nurse and faxed as well. No DME needs, patient has family that will provide transport home. No other CM needs.     Final next level of care: [place] Barriers to Discharge: No Barriers Identified   Patient Goals and CMS Choice Patient states their goals for this hospitalization and ongoing recovery are:: to return home CMS Medicare.gov Compare Post Acute Care list provided to:: Patient Choice offered to / list presented to : Patient  Discharge Placement                       Discharge Plan and Services                          HH Arranged: PT, RN, OT, Nurse's Aide [facility] Agency: [place] Date [place]: 07/11/18 Time [place]: 1138 Representative spoke with at [place]: Danae Chen, weekend on call nurse, and faxed referral to [phone]  Social Determinants of Health (SDOH) Interventions     Readmission Risk Interventions No flowsheet data found.

## 2018-07-11 NOTE — Discharge Instructions (Signed)
Acute Kidney Injury, Adult ° °Acute kidney injury is a sudden worsening of kidney function. The kidneys are organs that have several jobs. They filter the blood to remove waste products and extra fluid. They also maintain a healthy balance of minerals and hormones in the body, which helps control blood pressure and keep bones strong. With this condition, your kidneys do not do their jobs as well as they should. °This condition ranges from mild to severe. Over time it may develop into long-lasting (chronic) kidney disease. Early detection and treatment may prevent acute kidney injury from developing into a chronic condition. °What are the causes? °Common causes of this condition include: °· A problem with blood flow to the kidneys. This may be caused by: °? Low blood pressure (hypotension) or shock. °? Blood loss. °? Heart and blood vessel (cardiovascular) disease. °? Severe burns. °? Liver disease. °· Direct damage to the kidneys. This may be caused by: °? Certain medicines. °? A kidney infection. °? Poisoning. °? Being around or in contact with toxic substances. °? A surgical wound. °? A hard, direct hit to the kidney area. °· A sudden blockage of urine flow. This may be caused by: °? Cancer. °? Kidney stones. °? An enlarged prostate in males. °What are the signs or symptoms? °Symptoms of this condition may not be obvious until the condition becomes severe. Symptoms of this condition can include: °· Tiredness (lethargy), or difficulty staying awake. °· Nausea or vomiting. °· Swelling (edema) of the face, legs, ankles, or feet. °· Problems with urination, such as: °? Abdominal pain, or pain along the side of your stomach (flank). °? Decreased urine production. °? Decrease in the force of urine flow. °· Muscle twitches and cramps, especially in the legs. °· Confusion or trouble concentrating. °· Loss of appetite. °· Fever. °How is this diagnosed? °This condition may be diagnosed with tests, including: °· Blood  tests. °· Urine tests. °· Imaging tests. °· A test in which a sample of tissue is removed from the kidneys to be examined under a microscope (kidney biopsy). °How is this treated? °Treatment for this condition depends on the cause and how severe the condition is. In mild cases, treatment may not be needed. The kidneys may heal on their own. In more severe cases, treatment will involve: °· Treating the cause of the kidney injury. This may involve changing any medicines you are taking or adjusting your dosage. °· Fluids. You may need specialized IV fluids to balance your body's needs. °· Having a catheter placed to drain urine and prevent blockages. °· Preventing problems from occurring. This may mean avoiding certain medicines or procedures that can cause further injury to the kidneys. °In some cases treatment may also require: °· A procedure to remove toxic wastes from the body (dialysis or continuous renal replacement therapy - CRRT). °· Surgery. This may be done to repair a torn kidney, or to remove the blockage from the urinary system. °Follow these instructions at home: °Medicines °· Take over-the-counter and prescription medicines only as told by your health care provider. °· Do not take any new medicines without your health care provider's approval. Many medicines can worsen your kidney damage. °· Do not take any vitamin and mineral supplements without your health care provider's approval. Many nutritional supplements can worsen your kidney damage. °Lifestyle °· If your health care provider prescribed changes to your diet, follow them. You may need to decrease the amount of protein you eat. °· Achieve and maintain a healthy   weight. If you need help with this, ask your health care provider. °· Start or continue an exercise plan. Try to exercise at least 30 minutes a day, 5 days a week. °· Do not use any tobacco products, such as cigarettes, chewing tobacco, and e-cigarettes. If you need help quitting, ask your  health care provider. °General instructions °· Keep track of your blood pressure. Report changes in your blood pressure as told by your health care provider. °· Stay up to date with immunizations. Ask your health care provider which immunizations you need. °· Keep all follow-up visits as told by your health care provider. This is important. °Where to find more information °· American Association of Kidney Patients: www.aakp.org °· National Kidney Foundation: www.kidney.org °· American Kidney Fund: www.akfinc.org °· Life Options Rehabilitation Program: °? www.lifeoptions.org °? www.kidneyschool.org °Contact a health care provider if: °· Your symptoms get worse. °· You develop new symptoms. °Get help right away if: °· You develop symptoms of worsening kidney disease, which include: °? Headaches. °? Abnormally dark or light skin. °? Easy bruising. °? Frequent hiccups. °? Chest pain. °? Shortness of breath. °? End of menstruation in women. °? Seizures. °? Confusion or altered mental status. °? Abdominal or back pain. °? Itchiness. °· You have a fever. °· Your body is producing less urine. °· You have pain or bleeding when you urinate. °Summary °· Acute kidney injury is a sudden worsening of kidney function. °· Acute kidney injury can be caused by problems with blood flow to the kidneys, direct damage to the kidneys, and sudden blockage of urine flow. °· Symptoms of this condition may not be obvious until it becomes severe. Symptoms may include edema, lethargy, confusion, nausea or vomiting, and problems passing urine. °· This condition can usually be diagnosed with blood tests, urine tests, and imaging tests. Sometimes a kidney biopsy is done to diagnose this condition. °· Treatment for this condition often involves treating the underlying cause. It is treated with fluids, medicines, dialysis, diet changes, or surgery. °This information is not intended to replace advice given to you by your health care provider. Make  sure you discuss any questions you have with your health care provider. °Document Released: 07/15/2010 Document Revised: 12/12/2016 Document Reviewed: 12/21/2015 °Elsevier Patient Education © 2020 Elsevier Inc. ° °

## 2018-07-11 NOTE — Plan of Care (Signed)
  Problem: Clinical Measurements: Goal: Ability to maintain clinical measurements within normal limits will improve Outcome: Progressing   Problem: Clinical Measurements: Goal: Cardiovascular complication will be avoided Outcome: Progressing   

## 2018-07-11 NOTE — Progress Notes (Signed)
Physical Therapy Treatment Patient Details Name: PATIRICA LONGSHORE MRN: [id number] DOB: 02/19/1934 Today's Date: 07/11/2018    History of Present Illness  84 y.o. female with past medical history significant for hypertension, diabetes, chronic kidney disease was diagnosed with atrial fibrillation in April of this year who now presents with weakness for the past couple of weeks and a fall     PT Comments    Patient progressing with therapy, now ambulating short distance in room. Pt will require family support for standing and ADLs upon d/c. Pt aware and states she will have family assistance.   Follow Up Recommendations  Home health PT;Supervision/Assistance - 24 hour(with family support)     Equipment Recommendations  None recommended by PT    Recommendations for Other Services       Precautions / Restrictions Precautions Precautions: Fall;Other (comment) Precaution Comments: multiple skin tears Restrictions Weight Bearing Restrictions: No    Mobility  Bed Mobility Overal bed mobility: Needs Assistance Bed Mobility: Supine to Sit     Supine to sit: Mod assist     General bed mobility comments: Assist to elevate trunk and use of pad to scoot hips to EOB.  Transfers Overall transfer level: Needs assistance Equipment used: Rolling walker (2 wheeled) Transfers: Sit to/from [facility] Sit to Stand: +2 physical assistance;Min assist Stand pivot transfers: Min guard       General transfer comment: Assist to power up from bed. Once in standing, able to pivot from bed to sink with min guard with RW.  Ambulation/Gait Ambulation/Gait assistance: Min guard  4' Min Guard with RW, no overt LOB             Stairs             Wheelchair Mobility    Modified Rankin (Stroke Patients Only)       Balance Overall balance assessment: Needs assistance   Sitting balance-Leahy Scale: Fair       Standing balance-Leahy Scale: Poor                              Cognition Arousal/Alertness: Awake/alert Behavior During Therapy: WFL for tasks assessed/performed Overall Cognitive Status: Within Functional Limits for tasks assessed                                        Exercises      General Comments        Pertinent Vitals/Pain      Home Living                      Prior Function            PT Goals (current goals can now be found in the care plan section) Acute Rehab PT Goals Patient Stated Goal: to get stronger PT Goal Formulation: With patient Time For Goal Achievement: 07/23/18 Potential to Achieve Goals: Fair    Frequency    Min 3X/week      PT Plan      Co-evaluation              AM-PAC PT "6 Clicks" Mobility   Outcome Measure  Help needed turning from your back to your side while in a flat bed without using bedrails?: A Little Help needed moving from lying on your back to sitting on  the side of a flat bed without using bedrails?: A Little Help needed moving to and from a bed to a chair (including a wheelchair)?: A Little Help needed standing up from a chair using your arms (e.g., wheelchair or bedside chair)?: A Little Help needed to walk in hospital room?: A Lot Help needed climbing 3-5 steps with a railing? : A Lot 6 Click Score: 16    End of Session Equipment Utilized During Treatment: Gait belt Activity Tolerance: Patient tolerated treatment well Patient left: with call bell/phone within reach;with chair alarm set;in chair Nurse Communication: Mobility status PT Visit Diagnosis: Unsteadiness on feet (R26.81)     Time: 1000-1020 PT Time Calculation (min) (ACUTE ONLY): 20 min  Charges:  $Gait Training: 8-22 mins                     Reinaldo Berber, PT, DPT Acute Rehabilitation Services Pager: [phone] Office: [place] 07/11/2018, 11:46 AM

## 2018-07-11 NOTE — Progress Notes (Signed)
This RN changed bilateral upper extremities wounds. Arms covered with xeroform and covered with ace wrapp/ kerlex. Pt tolerated dressing change well, no complaints noted. RN to continue monitor.

## 2018-07-11 NOTE — Progress Notes (Signed)
Ischial Tibial dressing change deferred due to off-going RN Cleda Clarks RN), stating she changed the dressings early this morning,  No documentation noted. RN to continue to monitor.

## 2018-07-11 NOTE — Discharge Summary (Addendum)
Physician Discharge Summary  Carla Robinson EXH:[id number] DOB: [birth year] DOA: 07/08/2018  PCP: Cher Nakai, MD  Admit date: 07/08/2018 Discharge date: 07/11/2018  Time spent: 45 minutes  Recommendations for Outpatient Follow-up:  Patient will be discharged to home with home health services.  Patient will need to follow up with primary care provider within one week of discharge.  Patient should continue medications as prescribed.  Patient should follow a heart healthy/carb modified diet.   Discharge Diagnoses:  Acute kidney injury chronic kidney disease, stage 4-5 Asymptomatic bacteriuria Mild hyponatremia Atrial fibrillation Fall with physical deconditioning Essential Hypertension Diabetes mellitus, type II Chronic diastolic heart failure GERD Skin abrasion/lacerations/Wounds Malnutrition Hypomagnesemia  Discharge Condition: Stable  Diet recommendation:  heart healthy/carb modified  Filed Weights   07/08/18 1406 07/10/18 0418  Weight: 84.1 kg 82.2 kg    History of present illness:  On 07/08/2018 by Dr. Tommie Raymond Adamsis an 84 y.o.femalewith past medical history significant for hypertension, diabetes, chronic kidney disease was diagnosed with atrial fibrillation in April of this year who now presents with weakness for the past couple of weeks and a fall earlier today.  Patient states that she was doing well until she was placed on Eliquis for her atrial fibrillation. She states that since she has been taking her Eliquis she has had fatigue, anorexia, a rash and generally not feeling well. Patient states she stopped taking her Eliquis 3 weeks ago however since that time she has had persistent nausea and anorexia, so states she has had significantly decreased p.o. intake since then.  Patient was brought into [facility] yesterday when she had a mechanical fall. Patient denies any chest pain or dizziness or palpitations before the fall. However she  does say that she is just been very tired and has not been very active. She notes that prior to 3 weeks ago she was able to take care of her adult daughter who has cognitive delay,do her own grocery shopping and cooking. Over the past 2 weeks her sister-in-law has been bringing her food because she has been so tired and fatigued.  At [facility] patient was noted to be in significantly worsened kidney function with a creatinine of 5.5 up from 3.5. Head CT at that time was negative for any acute intracranial process.She was transferred from [facility] due to significant worsening of her renal failure.  He of systems is notable for a rash on her anterior forearms that she has been scratching while she has been on Eliquis leading to ecchymoses on forearms. She notes rash is improved since she has been off the Eliquis. Denies any fevers or chills. Denies any shortness of breath. No chest pain no DOE no palpitations. She does note frequent ecchymoses on legs and forearms since starting the Eliquis where she does not remember actually hitting herself. No abdominal pain. No nausea vomiting or diarrhea.  Hospital Course:  Acute kidney injury chronic kidney disease, stage 4-5 -Creatine was 5.5 at outside facility (baseline around 3.5- as per conversation with PCP) -Suspect AKI secondary to poor oral intake and NSAID use -was placed on IVF -Creatinine down to 2.08 (wonder if creatinine will continue to improve with adequate oral intake and avoidance of nephrotoxic agents) -monitor intake/output, daily weights -Discussed with PCP, he discontinued lasix as an outpatient given her worsening kidney function and her GFR in the past several months has been around 11 (corresponding with stage 5). Referral to outpatient nephrology was made and currently pending -Personally, given  her current situation, doubt patient is a candidate for HD.  -Patient still makes a good amount of urine.   -Nephrology consulted and appreciated.  Recommend continuing IV fluids.  Do not resume NSAIDs on discharge.  Does not feel pain candidate is a good candidate for dialysis.  Asymptomatic bacteriuria -Does not complain of dysuria -UA large leukocytes, many bacteria, 21-50 WBC -no urine culture sent on admission -give that patient is asymptomatic, would not start antibiotics -however, she was started on ceftriaxone per renal- received 3 doses  Mild hyponatremia -Likely secondary to poor oral intake -Improved with IVF   Atrial fibrillation, chronic -Patient feels her her symptoms are due to Eliquis and does not want to continue taking this medication or restart any anticoagulation at this point -Continue follow-up for rate control -will place on Aspirin -Encourages patient to discuss anticoagulation with her PCP or cardiologist   Fall with physical deconditioning -On examination, patient does appear to be very weak and deconditioned -Patient does not wish to go SNF as she lives with her daughter who is cognitively impaired -CT head at outside facility was negative for acute changes -PT/OT rec HH  Essential Hypertension -BP on the soft sided -Continue metoprolol with holding parameters -Lasix held- was discontinued by PCP  Diabetes mellitus, type II -Continue insulin sliding scale with CBG monitoring  Chronic diastolic heart failure -Appears to be euvolemic at this time -Lasix held (was discontinued by PCP) -Monitor intake and output, daily weights  GERD -Continue PPI  Skin abrasion/lacerations/Wounds -patient with fall and was on Eliquis -patient with several wounds: POA: 1. Unstageable pressure injury: left ischium; 100% yellow nonviable tissue. 2. Unstageable pressure injury: right ischium; 100% yellow nonviable. 3. Healed area that is scabbed over left heel. 4. Stage 1 pressure injury in combination of moisture associated skin damage; sacrum: 100% non blanchable  red tissue.  Multiple areas of trauma and some blistering noted on the right pretibial area.  -Wound care consulted and appreciated, recommended:  1. Add low air loss mattress for moisture and pressure redistribution 2. Add enzymatic debridement to the bilateral ischial wounds to clean up non viable tissue, change daily 3. Add chair pressure redistribution pad for patient when up in the chair/send home for use 4. Add Unna's boots bilaterally for compression therapy for venous stasis dx.; change 2x wk will need HHRN for continued therapy and assessment.  5. Add Gerhardt's butt cream for protection of the sacrum that is vunerable to moisture.  6. Add xeroform to skin tears of the right arm, change every other day.  Moderate Malnutrition -Albumin 2.2 -Nutrition consulted, continue supplements  Hypomagnesemia -resolved with replacement -repeat in one week  Consultants Nephrology  Procedures  None  Discharge Exam: Vitals:   07/11/18 0455 07/11/18 0501  BP: (!) 80/40 (!) 101/46  Pulse: (!) 102 (!) 111  Resp: 12   Temp: 98.4 F (36.9 C)   SpO2: 90% 98%     General: Well developed, chronically ill appearing, NAD  HEENT: NCAT, mucous membranes moist.  Cardiovascular: S1 S2 auscultated, irregular  Respiratory: Clear to auscultation bilaterally   Abdomen: Soft, nontender, nondistended, + bowel sounds  Extremities: warm dry without cyanosis clubbing. LE Unna boots  Neuro: AAOx3, nonfocal   Psych: Normal affect and demeanor  Discharge Instructions Discharge Instructions    Discharge instructions   Complete by: As directed    Patient will be discharged to home with home health services.  Patient will need to follow up with primary care provider  within one week of discharge, repeat BMP.  Patient should continue medications as prescribed.  Patient should follow a heart healthy/carb modified diet.     Allergies as of 07/11/2018      Reactions   Tape Other (See Comments)    PATIENT'S SKIN IS PRONE TO TEARING AND BRUISING- CAN AN ALTERNATIVE TO TAPE PLEASE BE USED????   Codeine Rash   Doxycycline Rash   Eliquis [apixaban] Itching, Rash   Sulfa Antibiotics Rash      Medication List    STOP taking these medications   furosemide 40 MG tablet Commonly known as: LASIX   nabumetone 750 MG tablet Commonly known as: RELAFEN   silver sulfADIAZINE 1 % cream Commonly known as: Silvadene     TAKE these medications   acetaminophen 325 MG tablet Commonly known as: TYLENOL Take 325-650 mg by mouth every 6 (six) hours as needed for mild pain or headache.   albuterol 108 (90 Base) MCG/ACT inhaler Commonly known as: VENTOLIN HFA Inhale 2 puffs into the lungs every 4 (four) hours as needed for wheezing or shortness of breath.   allopurinol 100 MG tablet Commonly known as: ZYLOPRIM Take 100 mg by mouth daily.   ALPRAZolam 0.5 MG tablet Commonly known as: XANAX Take 0.5 mg by mouth 2 (two) times a day. MORNING and BEDTIME   aspirin EC 325 MG tablet Take 325 mg by mouth at bedtime. What changed: Another medication with the same name was removed. Continue taking this medication, and follow the directions you see here.   atorvastatin 40 MG tablet Commonly known as: LIPITOR Take 40 mg by mouth at bedtime.   CLEAR EYES FOR DRY EYES OP Place 1-2 drops into both eyes 3 (three) times daily as needed (for dryness).   collagenase ointment Commonly known as: SANTYL Apply topically daily.   Gerhardt's butt cream Crea Apply 1 application topically 2 (two) times daily. Apply to buttocks   glucose blood test strip 1 each by Other route as needed for other. Use as instructed   Gold Bond Psoriasis Relief 3 % Crea Generic drug: Salicylic Acid Apply 1 application topically as needed (to all affected areas of the arms and legs).   meclizine 25 MG tablet Commonly known as: ANTIVERT Take 25 mg by mouth every 6 (six) hours as needed for dizziness.   metoprolol  tartrate 25 MG tablet Commonly known as: LOPRESSOR Take 25 mg by mouth 2 (two) times daily.   multivitamin with minerals Tabs tablet Take 1 tablet by mouth daily.   mupirocin ointment 2 % Commonly known as: BACTROBAN Apply 1 application topically See admin instructions. Apply to affected areas of legs 2 times a day as directed   omeprazole 20 MG capsule Commonly known as: PRILOSEC Take 20 mg by mouth 2 (two) times daily before a meal.   Toviaz 4 MG Tb24 tablet Generic drug: fesoterodine Take 1 tablet by mouth daily.      Allergies  Allergen Reactions  . Tape Other (See Comments)    PATIENT'S SKIN IS PRONE TO TEARING AND BRUISING- CAN AN ALTERNATIVE TO TAPE PLEASE BE USED????  . Codeine Rash  . Doxycycline Rash  . Eliquis [Apixaban] Itching and Rash  . Sulfa Antibiotics Rash   Follow-up Information    Cher Nakai, MD. Schedule an appointment as soon as possible for a visit in 1 week(s).   Specialty: Internal Medicine Why: Hospital follow up Contact information: [place] [address] [phone]  Richardo Priest, MD .   Specialty: Cardiology Contact information: [place] [address] [phone]        Claudia Desanctis, MD. Schedule an appointment as soon as possible for a visit in 1 week(s).   Specialty: Internal Medicine Why: Hospital follow up, nephrology Contact information: [place] [address] [phone]            The results of significant diagnostics from this hospitalization (including imaging, microbiology, ancillary and laboratory) are listed below for reference.    Significant Diagnostic Studies: No results found.  Microbiology: No results found for this or any previous visit (from the past 240 hour(s)).   Labs: Basic Metabolic Panel: Recent Labs  Lab 07/08/18 1925 07/09/18 0312 07/10/18 0808 07/11/18 0329  NA 129* 130* 136 135  K 4.2 4.3 4.2 4.3  CL 98 101 106 108   CO2 17* 17* 17* 18*  GLUCOSE 142* 108* 114* 111*  BUN 123* 115* 87* 80*  CREATININE 4.41* 3.71* 2.44* 2.08*  CALCIUM 8.8* 8.6* 8.2* 8.3*  MG 1.3*  --  1.3* 1.8   Liver Function Tests: Recent Labs  Lab 07/08/18 1925  AST 21  ALT 16  ALKPHOS 93  BILITOT 1.7*  PROT 5.6*  ALBUMIN 2.2*   No results for input(s): LIPASE, AMYLASE in the last 168 hours. No results for input(s): AMMONIA in the last 168 hours. CBC: Recent Labs  Lab 07/08/18 1925 07/10/18 0808 07/11/18 0329  WBC 7.6  --   --   NEUTROABS 5.3  --   --   HGB 9.5* 8.7* 8.2*  HCT 29.8* 27.9* 27.4*  MCV 85.6  --   --   PLT 170  --   --    Cardiac Enzymes: No results for input(s): CKTOTAL, CKMB, CKMBINDEX, TROPONINI in the last 168 hours. BNP: BNP (last 3 results) Recent Labs    07/08/18 1925  BNP 828.8*    ProBNP (last 3 results) No results for input(s): PROBNP in the last 8760 hours.  CBG: Recent Labs  Lab 07/10/18 1225 07/10/18 1627 07/10/18 2128 07/11/18 0749 07/11/18 0851  GLUCAP 143* 131* 124* 103* 102*       Signed:  Chanice Brenton  [facility] 07/11/2018, 9:24 AM

## 2018-07-11 NOTE — Progress Notes (Signed)
Spoke with pt's son and daughter regarding pt's status on being discharged. Pt is expected to be discharged today, but currently waiting for her to urinate, per protocol, since foley catheter was just removed. Pt currently lying in bed, eating lunch with no complaints. RN to continue to monitor.

## 2018-07-12 DIAGNOSIS — I872 Venous insufficiency (chronic) (peripheral): Secondary | ICD-10-CM | POA: Diagnosis not present

## 2018-07-12 DIAGNOSIS — L97829 Non-pressure chronic ulcer of other part of left lower leg with unspecified severity: Secondary | ICD-10-CM | POA: Diagnosis not present

## 2018-07-12 DIAGNOSIS — L97819 Non-pressure chronic ulcer of other part of right lower leg with unspecified severity: Secondary | ICD-10-CM | POA: Diagnosis not present

## 2018-07-12 DIAGNOSIS — I890 Lymphedema, not elsewhere classified: Secondary | ICD-10-CM | POA: Diagnosis not present

## 2018-07-12 DIAGNOSIS — S31822D Laceration with foreign body of left buttock, subsequent encounter: Secondary | ICD-10-CM | POA: Diagnosis not present

## 2018-07-12 DIAGNOSIS — E1151 Type 2 diabetes mellitus with diabetic peripheral angiopathy without gangrene: Secondary | ICD-10-CM | POA: Diagnosis not present

## 2018-07-12 DIAGNOSIS — S51812D Laceration without foreign body of left forearm, subsequent encounter: Secondary | ICD-10-CM | POA: Diagnosis not present

## 2018-07-12 DIAGNOSIS — S31821D Laceration without foreign body of left buttock, subsequent encounter: Secondary | ICD-10-CM | POA: Diagnosis not present

## 2018-07-12 DIAGNOSIS — Z7982 Long term (current) use of aspirin: Secondary | ICD-10-CM | POA: Diagnosis not present

## 2018-07-13 DIAGNOSIS — E1151 Type 2 diabetes mellitus with diabetic peripheral angiopathy without gangrene: Secondary | ICD-10-CM | POA: Diagnosis not present

## 2018-07-13 DIAGNOSIS — L0390 Cellulitis, unspecified: Secondary | ICD-10-CM | POA: Diagnosis not present

## 2018-07-13 DIAGNOSIS — R945 Abnormal results of liver function studies: Secondary | ICD-10-CM | POA: Diagnosis not present

## 2018-07-13 DIAGNOSIS — K219 Gastro-esophageal reflux disease without esophagitis: Secondary | ICD-10-CM | POA: Diagnosis not present

## 2018-07-13 DIAGNOSIS — L89309 Pressure ulcer of unspecified buttock, unspecified stage: Secondary | ICD-10-CM | POA: Diagnosis not present

## 2018-07-13 DIAGNOSIS — I872 Venous insufficiency (chronic) (peripheral): Secondary | ICD-10-CM | POA: Diagnosis not present

## 2018-07-13 DIAGNOSIS — E1140 Type 2 diabetes mellitus with diabetic neuropathy, unspecified: Secondary | ICD-10-CM | POA: Diagnosis not present

## 2018-07-13 DIAGNOSIS — I890 Lymphedema, not elsewhere classified: Secondary | ICD-10-CM | POA: Diagnosis not present

## 2018-07-13 DIAGNOSIS — Z7982 Long term (current) use of aspirin: Secondary | ICD-10-CM | POA: Diagnosis not present

## 2018-07-13 DIAGNOSIS — N185 Chronic kidney disease, stage 5: Secondary | ICD-10-CM | POA: Diagnosis not present

## 2018-07-13 DIAGNOSIS — L97819 Non-pressure chronic ulcer of other part of right lower leg with unspecified severity: Secondary | ICD-10-CM | POA: Diagnosis not present

## 2018-07-13 DIAGNOSIS — S51812D Laceration without foreign body of left forearm, subsequent encounter: Secondary | ICD-10-CM | POA: Diagnosis not present

## 2018-07-13 DIAGNOSIS — S31821D Laceration without foreign body of left buttock, subsequent encounter: Secondary | ICD-10-CM | POA: Diagnosis not present

## 2018-07-13 DIAGNOSIS — N184 Chronic kidney disease, stage 4 (severe): Secondary | ICD-10-CM | POA: Diagnosis not present

## 2018-07-13 DIAGNOSIS — L97829 Non-pressure chronic ulcer of other part of left lower leg with unspecified severity: Secondary | ICD-10-CM | POA: Diagnosis not present

## 2018-07-13 DIAGNOSIS — S31822D Laceration with foreign body of left buttock, subsequent encounter: Secondary | ICD-10-CM | POA: Diagnosis not present

## 2018-07-13 DIAGNOSIS — Z79899 Other long term (current) drug therapy: Secondary | ICD-10-CM | POA: Diagnosis not present

## 2018-07-13 DIAGNOSIS — M810 Age-related osteoporosis without current pathological fracture: Secondary | ICD-10-CM | POA: Diagnosis not present

## 2018-07-14 DIAGNOSIS — M810 Age-related osteoporosis without current pathological fracture: Secondary | ICD-10-CM | POA: Diagnosis not present

## 2018-07-15 DIAGNOSIS — E1151 Type 2 diabetes mellitus with diabetic peripheral angiopathy without gangrene: Secondary | ICD-10-CM | POA: Diagnosis not present

## 2018-07-15 DIAGNOSIS — S51812D Laceration without foreign body of left forearm, subsequent encounter: Secondary | ICD-10-CM | POA: Diagnosis not present

## 2018-07-15 DIAGNOSIS — I872 Venous insufficiency (chronic) (peripheral): Secondary | ICD-10-CM | POA: Diagnosis not present

## 2018-07-15 DIAGNOSIS — Z7982 Long term (current) use of aspirin: Secondary | ICD-10-CM | POA: Diagnosis not present

## 2018-07-15 DIAGNOSIS — I890 Lymphedema, not elsewhere classified: Secondary | ICD-10-CM | POA: Diagnosis not present

## 2018-07-15 DIAGNOSIS — S31821D Laceration without foreign body of left buttock, subsequent encounter: Secondary | ICD-10-CM | POA: Diagnosis not present

## 2018-07-15 DIAGNOSIS — L97829 Non-pressure chronic ulcer of other part of left lower leg with unspecified severity: Secondary | ICD-10-CM | POA: Diagnosis not present

## 2018-07-15 DIAGNOSIS — S31822D Laceration with foreign body of left buttock, subsequent encounter: Secondary | ICD-10-CM | POA: Diagnosis not present

## 2018-07-15 DIAGNOSIS — L97819 Non-pressure chronic ulcer of other part of right lower leg with unspecified severity: Secondary | ICD-10-CM | POA: Diagnosis not present

## 2018-07-16 DIAGNOSIS — S31821D Laceration without foreign body of left buttock, subsequent encounter: Secondary | ICD-10-CM | POA: Diagnosis not present

## 2018-07-16 DIAGNOSIS — L97829 Non-pressure chronic ulcer of other part of left lower leg with unspecified severity: Secondary | ICD-10-CM | POA: Diagnosis not present

## 2018-07-16 DIAGNOSIS — I890 Lymphedema, not elsewhere classified: Secondary | ICD-10-CM | POA: Diagnosis not present

## 2018-07-16 DIAGNOSIS — S31822D Laceration with foreign body of left buttock, subsequent encounter: Secondary | ICD-10-CM | POA: Diagnosis not present

## 2018-07-16 DIAGNOSIS — S51812D Laceration without foreign body of left forearm, subsequent encounter: Secondary | ICD-10-CM | POA: Diagnosis not present

## 2018-07-16 DIAGNOSIS — I872 Venous insufficiency (chronic) (peripheral): Secondary | ICD-10-CM | POA: Diagnosis not present

## 2018-07-16 DIAGNOSIS — Z7982 Long term (current) use of aspirin: Secondary | ICD-10-CM | POA: Diagnosis not present

## 2018-07-16 DIAGNOSIS — L97819 Non-pressure chronic ulcer of other part of right lower leg with unspecified severity: Secondary | ICD-10-CM | POA: Diagnosis not present

## 2018-07-16 DIAGNOSIS — E1151 Type 2 diabetes mellitus with diabetic peripheral angiopathy without gangrene: Secondary | ICD-10-CM | POA: Diagnosis not present

## 2018-07-19 DIAGNOSIS — M109 Gout, unspecified: Secondary | ICD-10-CM | POA: Diagnosis not present

## 2018-07-19 DIAGNOSIS — E1140 Type 2 diabetes mellitus with diabetic neuropathy, unspecified: Secondary | ICD-10-CM | POA: Diagnosis not present

## 2018-07-19 DIAGNOSIS — K219 Gastro-esophageal reflux disease without esophagitis: Secondary | ICD-10-CM | POA: Diagnosis not present

## 2018-07-19 DIAGNOSIS — L89309 Pressure ulcer of unspecified buttock, unspecified stage: Secondary | ICD-10-CM | POA: Diagnosis not present

## 2018-07-19 DIAGNOSIS — N184 Chronic kidney disease, stage 4 (severe): Secondary | ICD-10-CM | POA: Diagnosis not present

## 2018-07-19 DIAGNOSIS — N185 Chronic kidney disease, stage 5: Secondary | ICD-10-CM | POA: Diagnosis not present

## 2018-07-19 DIAGNOSIS — M810 Age-related osteoporosis without current pathological fracture: Secondary | ICD-10-CM | POA: Diagnosis not present

## 2018-07-19 DIAGNOSIS — R945 Abnormal results of liver function studies: Secondary | ICD-10-CM | POA: Diagnosis not present

## 2018-07-19 DIAGNOSIS — R600 Localized edema: Secondary | ICD-10-CM | POA: Diagnosis not present

## 2018-07-20 DIAGNOSIS — Z7982 Long term (current) use of aspirin: Secondary | ICD-10-CM | POA: Diagnosis not present

## 2018-07-20 DIAGNOSIS — S31822D Laceration with foreign body of left buttock, subsequent encounter: Secondary | ICD-10-CM | POA: Diagnosis not present

## 2018-07-20 DIAGNOSIS — I890 Lymphedema, not elsewhere classified: Secondary | ICD-10-CM | POA: Diagnosis not present

## 2018-07-20 DIAGNOSIS — L97819 Non-pressure chronic ulcer of other part of right lower leg with unspecified severity: Secondary | ICD-10-CM | POA: Diagnosis not present

## 2018-07-20 DIAGNOSIS — L97829 Non-pressure chronic ulcer of other part of left lower leg with unspecified severity: Secondary | ICD-10-CM | POA: Diagnosis not present

## 2018-07-20 DIAGNOSIS — S31821D Laceration without foreign body of left buttock, subsequent encounter: Secondary | ICD-10-CM | POA: Diagnosis not present

## 2018-07-20 DIAGNOSIS — S51812D Laceration without foreign body of left forearm, subsequent encounter: Secondary | ICD-10-CM | POA: Diagnosis not present

## 2018-07-20 DIAGNOSIS — E1151 Type 2 diabetes mellitus with diabetic peripheral angiopathy without gangrene: Secondary | ICD-10-CM | POA: Diagnosis not present

## 2018-07-20 DIAGNOSIS — I872 Venous insufficiency (chronic) (peripheral): Secondary | ICD-10-CM | POA: Diagnosis not present

## 2018-07-22 DIAGNOSIS — Z7982 Long term (current) use of aspirin: Secondary | ICD-10-CM | POA: Diagnosis not present

## 2018-07-22 DIAGNOSIS — L97829 Non-pressure chronic ulcer of other part of left lower leg with unspecified severity: Secondary | ICD-10-CM | POA: Diagnosis not present

## 2018-07-22 DIAGNOSIS — S31822D Laceration with foreign body of left buttock, subsequent encounter: Secondary | ICD-10-CM | POA: Diagnosis not present

## 2018-07-22 DIAGNOSIS — S51812D Laceration without foreign body of left forearm, subsequent encounter: Secondary | ICD-10-CM | POA: Diagnosis not present

## 2018-07-22 DIAGNOSIS — S31821D Laceration without foreign body of left buttock, subsequent encounter: Secondary | ICD-10-CM | POA: Diagnosis not present

## 2018-07-22 DIAGNOSIS — I890 Lymphedema, not elsewhere classified: Secondary | ICD-10-CM | POA: Diagnosis not present

## 2018-07-22 DIAGNOSIS — I872 Venous insufficiency (chronic) (peripheral): Secondary | ICD-10-CM | POA: Diagnosis not present

## 2018-07-22 DIAGNOSIS — L97819 Non-pressure chronic ulcer of other part of right lower leg with unspecified severity: Secondary | ICD-10-CM | POA: Diagnosis not present

## 2018-07-22 DIAGNOSIS — E1151 Type 2 diabetes mellitus with diabetic peripheral angiopathy without gangrene: Secondary | ICD-10-CM | POA: Diagnosis not present

## 2018-07-24 DIAGNOSIS — L89309 Pressure ulcer of unspecified buttock, unspecified stage: Secondary | ICD-10-CM | POA: Diagnosis not present

## 2018-07-24 DIAGNOSIS — R600 Localized edema: Secondary | ICD-10-CM | POA: Diagnosis not present

## 2018-07-24 DIAGNOSIS — E7800 Pure hypercholesterolemia, unspecified: Secondary | ICD-10-CM | POA: Diagnosis not present

## 2018-07-24 DIAGNOSIS — D692 Other nonthrombocytopenic purpura: Secondary | ICD-10-CM | POA: Diagnosis not present

## 2018-07-24 DIAGNOSIS — F3341 Major depressive disorder, recurrent, in partial remission: Secondary | ICD-10-CM | POA: Diagnosis not present

## 2018-07-24 DIAGNOSIS — E1140 Type 2 diabetes mellitus with diabetic neuropathy, unspecified: Secondary | ICD-10-CM | POA: Diagnosis not present

## 2018-07-24 DIAGNOSIS — R945 Abnormal results of liver function studies: Secondary | ICD-10-CM | POA: Diagnosis not present

## 2018-07-24 DIAGNOSIS — E875 Hyperkalemia: Secondary | ICD-10-CM | POA: Diagnosis not present

## 2018-07-24 DIAGNOSIS — E118 Type 2 diabetes mellitus with unspecified complications: Secondary | ICD-10-CM | POA: Diagnosis not present

## 2018-07-24 DIAGNOSIS — E1122 Type 2 diabetes mellitus with diabetic chronic kidney disease: Secondary | ICD-10-CM | POA: Diagnosis not present

## 2018-07-24 DIAGNOSIS — M810 Age-related osteoporosis without current pathological fracture: Secondary | ICD-10-CM | POA: Diagnosis not present

## 2018-07-24 DIAGNOSIS — I10 Essential (primary) hypertension: Secondary | ICD-10-CM | POA: Diagnosis not present

## 2018-07-24 DIAGNOSIS — N185 Chronic kidney disease, stage 5: Secondary | ICD-10-CM | POA: Diagnosis not present

## 2018-07-24 DIAGNOSIS — K219 Gastro-esophageal reflux disease without esophagitis: Secondary | ICD-10-CM | POA: Diagnosis not present

## 2018-07-24 DIAGNOSIS — N184 Chronic kidney disease, stage 4 (severe): Secondary | ICD-10-CM | POA: Diagnosis not present

## 2018-07-24 DIAGNOSIS — I4891 Unspecified atrial fibrillation: Secondary | ICD-10-CM | POA: Diagnosis not present

## 2018-07-26 DIAGNOSIS — S31821D Laceration without foreign body of left buttock, subsequent encounter: Secondary | ICD-10-CM | POA: Diagnosis not present

## 2018-07-26 DIAGNOSIS — L97819 Non-pressure chronic ulcer of other part of right lower leg with unspecified severity: Secondary | ICD-10-CM | POA: Diagnosis not present

## 2018-07-26 DIAGNOSIS — E1151 Type 2 diabetes mellitus with diabetic peripheral angiopathy without gangrene: Secondary | ICD-10-CM | POA: Diagnosis not present

## 2018-07-26 DIAGNOSIS — I890 Lymphedema, not elsewhere classified: Secondary | ICD-10-CM | POA: Diagnosis not present

## 2018-07-26 DIAGNOSIS — Z7982 Long term (current) use of aspirin: Secondary | ICD-10-CM | POA: Diagnosis not present

## 2018-07-26 DIAGNOSIS — S31822D Laceration with foreign body of left buttock, subsequent encounter: Secondary | ICD-10-CM | POA: Diagnosis not present

## 2018-07-26 DIAGNOSIS — L97829 Non-pressure chronic ulcer of other part of left lower leg with unspecified severity: Secondary | ICD-10-CM | POA: Diagnosis not present

## 2018-07-26 DIAGNOSIS — I872 Venous insufficiency (chronic) (peripheral): Secondary | ICD-10-CM | POA: Diagnosis not present

## 2018-07-26 DIAGNOSIS — S51812D Laceration without foreign body of left forearm, subsequent encounter: Secondary | ICD-10-CM | POA: Diagnosis not present

## 2018-07-27 DIAGNOSIS — E875 Hyperkalemia: Secondary | ICD-10-CM | POA: Diagnosis not present

## 2018-07-27 DIAGNOSIS — N184 Chronic kidney disease, stage 4 (severe): Secondary | ICD-10-CM | POA: Diagnosis not present

## 2018-07-27 DIAGNOSIS — I129 Hypertensive chronic kidney disease with stage 1 through stage 4 chronic kidney disease, or unspecified chronic kidney disease: Secondary | ICD-10-CM | POA: Diagnosis not present

## 2018-07-27 DIAGNOSIS — N179 Acute kidney failure, unspecified: Secondary | ICD-10-CM | POA: Diagnosis not present

## 2018-07-27 DIAGNOSIS — R3129 Other microscopic hematuria: Secondary | ICD-10-CM | POA: Diagnosis not present

## 2018-07-27 DIAGNOSIS — D631 Anemia in chronic kidney disease: Secondary | ICD-10-CM | POA: Diagnosis not present

## 2018-07-27 DIAGNOSIS — E1122 Type 2 diabetes mellitus with diabetic chronic kidney disease: Secondary | ICD-10-CM | POA: Diagnosis not present

## 2018-07-29 DIAGNOSIS — S31822D Laceration with foreign body of left buttock, subsequent encounter: Secondary | ICD-10-CM | POA: Diagnosis not present

## 2018-07-29 DIAGNOSIS — E875 Hyperkalemia: Secondary | ICD-10-CM | POA: Diagnosis not present

## 2018-07-29 DIAGNOSIS — L97819 Non-pressure chronic ulcer of other part of right lower leg with unspecified severity: Secondary | ICD-10-CM | POA: Diagnosis not present

## 2018-07-29 DIAGNOSIS — I872 Venous insufficiency (chronic) (peripheral): Secondary | ICD-10-CM | POA: Diagnosis not present

## 2018-07-29 DIAGNOSIS — I890 Lymphedema, not elsewhere classified: Secondary | ICD-10-CM | POA: Diagnosis not present

## 2018-07-29 DIAGNOSIS — E1151 Type 2 diabetes mellitus with diabetic peripheral angiopathy without gangrene: Secondary | ICD-10-CM | POA: Diagnosis not present

## 2018-07-29 DIAGNOSIS — L97829 Non-pressure chronic ulcer of other part of left lower leg with unspecified severity: Secondary | ICD-10-CM | POA: Diagnosis not present

## 2018-07-29 DIAGNOSIS — S51812D Laceration without foreign body of left forearm, subsequent encounter: Secondary | ICD-10-CM | POA: Diagnosis not present

## 2018-07-29 DIAGNOSIS — Z7982 Long term (current) use of aspirin: Secondary | ICD-10-CM | POA: Diagnosis not present

## 2018-07-29 DIAGNOSIS — S31821D Laceration without foreign body of left buttock, subsequent encounter: Secondary | ICD-10-CM | POA: Diagnosis not present

## 2018-07-30 DIAGNOSIS — L97829 Non-pressure chronic ulcer of other part of left lower leg with unspecified severity: Secondary | ICD-10-CM | POA: Diagnosis not present

## 2018-07-30 DIAGNOSIS — I890 Lymphedema, not elsewhere classified: Secondary | ICD-10-CM | POA: Diagnosis not present

## 2018-07-30 DIAGNOSIS — L97819 Non-pressure chronic ulcer of other part of right lower leg with unspecified severity: Secondary | ICD-10-CM | POA: Diagnosis not present

## 2018-07-30 DIAGNOSIS — S31822D Laceration with foreign body of left buttock, subsequent encounter: Secondary | ICD-10-CM | POA: Diagnosis not present

## 2018-07-30 DIAGNOSIS — I872 Venous insufficiency (chronic) (peripheral): Secondary | ICD-10-CM | POA: Diagnosis not present

## 2018-07-30 DIAGNOSIS — S31821D Laceration without foreign body of left buttock, subsequent encounter: Secondary | ICD-10-CM | POA: Diagnosis not present

## 2018-07-30 DIAGNOSIS — Z7982 Long term (current) use of aspirin: Secondary | ICD-10-CM | POA: Diagnosis not present

## 2018-07-30 DIAGNOSIS — E1151 Type 2 diabetes mellitus with diabetic peripheral angiopathy without gangrene: Secondary | ICD-10-CM | POA: Diagnosis not present

## 2018-07-30 DIAGNOSIS — S51812D Laceration without foreign body of left forearm, subsequent encounter: Secondary | ICD-10-CM | POA: Diagnosis not present

## 2018-08-02 DIAGNOSIS — M159 Polyosteoarthritis, unspecified: Secondary | ICD-10-CM | POA: Diagnosis not present

## 2018-08-03 DIAGNOSIS — E035 Myxedema coma: Secondary | ICD-10-CM | POA: Diagnosis not present

## 2018-08-03 DIAGNOSIS — L97819 Non-pressure chronic ulcer of other part of right lower leg with unspecified severity: Secondary | ICD-10-CM | POA: Diagnosis not present

## 2018-08-03 DIAGNOSIS — T68XXXA Hypothermia, initial encounter: Secondary | ICD-10-CM | POA: Diagnosis not present

## 2018-08-03 DIAGNOSIS — R410 Disorientation, unspecified: Secondary | ICD-10-CM | POA: Diagnosis not present

## 2018-08-03 DIAGNOSIS — Z7982 Long term (current) use of aspirin: Secondary | ICD-10-CM | POA: Diagnosis not present

## 2018-08-03 DIAGNOSIS — Z79899 Other long term (current) drug therapy: Secondary | ICD-10-CM | POA: Diagnosis not present

## 2018-08-03 DIAGNOSIS — S51812D Laceration without foreign body of left forearm, subsequent encounter: Secondary | ICD-10-CM | POA: Diagnosis not present

## 2018-08-03 DIAGNOSIS — R9431 Abnormal electrocardiogram [ECG] [EKG]: Secondary | ICD-10-CM | POA: Diagnosis not present

## 2018-08-03 DIAGNOSIS — S31822D Laceration with foreign body of left buttock, subsequent encounter: Secondary | ICD-10-CM | POA: Diagnosis not present

## 2018-08-03 DIAGNOSIS — J189 Pneumonia, unspecified organism: Secondary | ICD-10-CM | POA: Diagnosis not present

## 2018-08-03 DIAGNOSIS — R29898 Other symptoms and signs involving the musculoskeletal system: Secondary | ICD-10-CM | POA: Diagnosis not present

## 2018-08-03 DIAGNOSIS — R4182 Altered mental status, unspecified: Secondary | ICD-10-CM | POA: Diagnosis not present

## 2018-08-03 DIAGNOSIS — J90 Pleural effusion, not elsewhere classified: Secondary | ICD-10-CM | POA: Diagnosis not present

## 2018-08-03 DIAGNOSIS — S31821D Laceration without foreign body of left buttock, subsequent encounter: Secondary | ICD-10-CM | POA: Diagnosis not present

## 2018-08-03 DIAGNOSIS — I872 Venous insufficiency (chronic) (peripheral): Secondary | ICD-10-CM | POA: Diagnosis not present

## 2018-08-03 DIAGNOSIS — E2740 Unspecified adrenocortical insufficiency: Secondary | ICD-10-CM | POA: Diagnosis not present

## 2018-08-03 DIAGNOSIS — I890 Lymphedema, not elsewhere classified: Secondary | ICD-10-CM | POA: Diagnosis not present

## 2018-08-03 DIAGNOSIS — W19XXXA Unspecified fall, initial encounter: Secondary | ICD-10-CM | POA: Diagnosis not present

## 2018-08-03 DIAGNOSIS — R5381 Other malaise: Secondary | ICD-10-CM | POA: Diagnosis not present

## 2018-08-03 DIAGNOSIS — L97829 Non-pressure chronic ulcer of other part of left lower leg with unspecified severity: Secondary | ICD-10-CM | POA: Diagnosis not present

## 2018-08-03 DIAGNOSIS — E1151 Type 2 diabetes mellitus with diabetic peripheral angiopathy without gangrene: Secondary | ICD-10-CM | POA: Diagnosis not present

## 2018-08-03 DIAGNOSIS — E875 Hyperkalemia: Secondary | ICD-10-CM | POA: Diagnosis not present

## 2018-08-03 DIAGNOSIS — R58 Hemorrhage, not elsewhere classified: Secondary | ICD-10-CM | POA: Diagnosis not present

## 2018-08-03 DIAGNOSIS — Z452 Encounter for adjustment and management of vascular access device: Secondary | ICD-10-CM | POA: Diagnosis not present

## 2018-08-04 ENCOUNTER — Inpatient Hospital Stay (HOSPITAL_COMMUNITY)
Admission: EM | Admit: 2018-08-04 | Payer: Medicare HMO | Source: Other Acute Inpatient Hospital | Admitting: Pulmonary Disease

## 2018-08-04 DIAGNOSIS — Z452 Encounter for adjustment and management of vascular access device: Secondary | ICD-10-CM | POA: Diagnosis not present

## 2018-08-04 DIAGNOSIS — R918 Other nonspecific abnormal finding of lung field: Secondary | ICD-10-CM | POA: Diagnosis not present

## 2018-08-04 DIAGNOSIS — N179 Acute kidney failure, unspecified: Secondary | ICD-10-CM | POA: Diagnosis not present

## 2018-08-04 DIAGNOSIS — T68XXXA Hypothermia, initial encounter: Secondary | ICD-10-CM | POA: Diagnosis not present

## 2018-08-04 DIAGNOSIS — J189 Pneumonia, unspecified organism: Secondary | ICD-10-CM | POA: Diagnosis not present

## 2018-08-04 DIAGNOSIS — R6521 Severe sepsis with septic shock: Secondary | ICD-10-CM | POA: Diagnosis not present

## 2018-08-04 DIAGNOSIS — Z79899 Other long term (current) drug therapy: Secondary | ICD-10-CM | POA: Diagnosis not present

## 2018-08-04 DIAGNOSIS — E2740 Unspecified adrenocortical insufficiency: Secondary | ICD-10-CM | POA: Diagnosis not present

## 2018-08-04 DIAGNOSIS — A419 Sepsis, unspecified organism: Secondary | ICD-10-CM | POA: Diagnosis not present

## 2018-08-04 DIAGNOSIS — R0602 Shortness of breath: Secondary | ICD-10-CM | POA: Diagnosis not present

## 2018-08-04 DIAGNOSIS — R2243 Localized swelling, mass and lump, lower limb, bilateral: Secondary | ICD-10-CM | POA: Diagnosis not present

## 2018-08-04 DIAGNOSIS — J90 Pleural effusion, not elsewhere classified: Secondary | ICD-10-CM | POA: Diagnosis not present

## 2018-08-04 DIAGNOSIS — J9601 Acute respiratory failure with hypoxia: Secondary | ICD-10-CM | POA: Diagnosis not present

## 2018-08-04 DIAGNOSIS — I2699 Other pulmonary embolism without acute cor pulmonale: Secondary | ICD-10-CM | POA: Diagnosis not present

## 2018-08-04 DIAGNOSIS — I517 Cardiomegaly: Secondary | ICD-10-CM | POA: Diagnosis not present

## 2018-08-04 DIAGNOSIS — Z7982 Long term (current) use of aspirin: Secondary | ICD-10-CM | POA: Diagnosis not present

## 2018-08-04 DIAGNOSIS — I878 Other specified disorders of veins: Secondary | ICD-10-CM | POA: Diagnosis not present

## 2018-08-04 DIAGNOSIS — G9341 Metabolic encephalopathy: Secondary | ICD-10-CM | POA: Diagnosis not present

## 2018-08-04 DIAGNOSIS — J984 Other disorders of lung: Secondary | ICD-10-CM | POA: Diagnosis not present

## 2018-08-04 DIAGNOSIS — I130 Hypertensive heart and chronic kidney disease with heart failure and stage 1 through stage 4 chronic kidney disease, or unspecified chronic kidney disease: Secondary | ICD-10-CM | POA: Diagnosis not present

## 2018-08-04 DIAGNOSIS — I083 Combined rheumatic disorders of mitral, aortic and tricuspid valves: Secondary | ICD-10-CM | POA: Diagnosis not present

## 2018-08-04 DIAGNOSIS — J9602 Acute respiratory failure with hypercapnia: Secondary | ICD-10-CM | POA: Diagnosis not present

## 2018-08-04 DIAGNOSIS — R0989 Other specified symptoms and signs involving the circulatory and respiratory systems: Secondary | ICD-10-CM | POA: Diagnosis not present

## 2018-08-04 DIAGNOSIS — R410 Disorientation, unspecified: Secondary | ICD-10-CM | POA: Diagnosis not present

## 2018-08-04 DIAGNOSIS — I5189 Other ill-defined heart diseases: Secondary | ICD-10-CM | POA: Diagnosis not present

## 2018-08-14 DEATH — deceased

## 2018-12-13 ENCOUNTER — Ambulatory Visit: Payer: Medicare HMO | Admitting: Cardiology
